# Patient Record
Sex: Male | Born: 1960 | Race: White | Hispanic: No | Smoking: Current every day smoker
Health system: Southern US, Community
[De-identification: ages and names within clinical notes are randomized; demographics above are authoritative.]

## PROBLEM LIST (undated history)

## (undated) DIAGNOSIS — W57XXXA Bitten or stung by nonvenomous insect and other nonvenomous arthropods, initial encounter: Secondary | ICD-10-CM

## (undated) DIAGNOSIS — F1010 Alcohol abuse, uncomplicated: Secondary | ICD-10-CM

## (undated) DIAGNOSIS — F32A Depression, unspecified: Secondary | ICD-10-CM

## (undated) DIAGNOSIS — F329 Major depressive disorder, single episode, unspecified: Secondary | ICD-10-CM

## (undated) DIAGNOSIS — M1990 Unspecified osteoarthritis, unspecified site: Secondary | ICD-10-CM

## (undated) HISTORY — DX: Major depressive disorder, single episode, unspecified: F32.9

## (undated) HISTORY — PX: LEG SURGERY: SHX1003

## (undated) HISTORY — PX: BACK SURGERY: SHX140

## (undated) HISTORY — PX: CARPAL TUNNEL RELEASE: SHX101

## (undated) HISTORY — DX: Unspecified osteoarthritis, unspecified site: M19.90

## (undated) HISTORY — PX: FRACTURE SURGERY: SHX138

## (undated) HISTORY — DX: Depression, unspecified: F32.A

## (undated) HISTORY — DX: Alcohol abuse, uncomplicated: F10.10

## (undated) HISTORY — DX: Bitten or stung by nonvenomous insect and other nonvenomous arthropods, initial encounter: W57.XXXA

---

## 2001-07-09 ENCOUNTER — Encounter: Payer: Self-pay | Admitting: Orthopedic Surgery

## 2001-07-09 ENCOUNTER — Encounter: Payer: Self-pay | Admitting: Emergency Medicine

## 2001-07-09 ENCOUNTER — Inpatient Hospital Stay (HOSPITAL_COMMUNITY): Admission: EM | Admit: 2001-07-09 | Discharge: 2001-07-12 | Payer: Self-pay | Admitting: Emergency Medicine

## 2010-02-17 ENCOUNTER — Emergency Department (HOSPITAL_COMMUNITY): Admission: EM | Admit: 2010-02-17 | Discharge: 2010-02-17 | Payer: Self-pay | Admitting: Emergency Medicine

## 2010-06-07 LAB — CULTURE, ROUTINE-ABSCESS

## 2010-08-12 NOTE — Discharge Summary (Signed)
[place]. [facility]  Patient:    Frank Patton, Frank Patton Visit Number: [id number] MRN: [id number]          Service Type: MED Location: 5000 [id number] Attending Physician:  Lubertha South Dictated by:   Ephriam Knuckles. Idolina Primer, P.A.C. Admit Date:  07/09/2001 Discharge Date: 07/12/2001                             Discharge Summary  ADMISSION DIAGNOSES: 1. Right tibia fibular fracture. 2. Smoking history.  DISCHARGE DIAGNOSES: 1. Right tibia fibular fracture. 2. Smoking history.  OPERATIONS: 1. Exploration, irrigation, and debridement of grade I open right tibia    fracture on July 09, 2001. 2. Reamed intermedullary nailing of right tibial fracture utilizing    Depuy titanium nail, statically locked.  ASSISTANT: Avel Peace, P.A.C.  CONSULTATIONS: None.  HISTORY OF PRESENT ILLNESS: This 50 year old gentleman was cutting down some tree limbs earlier on the day of admission. One of the tree limbs snapped and struck his right lower leg. He had immediate deformity to that leg and instability. He had pain and bleeding from the open wound. He was brought to the ER where a 2 cm transverse laceration of the medial aspect of the mid leg on the right was seen. Abundant bloody drainage with fat droplets was noted in this area. Fortunately, all of his lower leg compartments were soft and neurovascularly, he was intact. X-rays taken in the emergency room showed a mid tibia displaced fracture with butterfly fragment. There was also a transverse fracture of the adjacent fibula. Due to the nature of this injury being an open fracture as well as the instability, the patient was admitted for the above procedure.  HOSPITAL COURSE: The patient was placed on an antibiotic protocol with Gentamicin. This was due to the nature of the open fracture received from a dirty environment. We kept him on the antibiotic therapy for the prescribed time. Rather fortunately, the patient did  not have any evidence of any infection. We also supplemented the Gentamicin with Ancef for double coverage. The Ancef was discontinued on July 12, 2001 in the morning. This had been followed closely by the pharmacy with blood values as well. The Ancef was discontinued at discharge and the patient was switched to p.o. Keflex. Neurovascularly remained intact to the operative extremity throughout his hospitalization. Dressing changes showed no evidence of any infection. He was placed on Lovenox for DVT prevention while in the hospital and this was changed to enteric coated aspirin at the time of discharge.  LABORATORY DATA: White count 11.4 on admission. Red count was 3.35. Hemoglobin 11.1, hematocrit 32.8. This was essentially unchanged throughout his hospitalization. Blood chemistries were normal.  DIAGNOSTIC STUDIES: Chest x-ray showed no active disease. EKG showed normal sinus rhythm.  DISCHARGE CONDITION: Improved and stable.  DISPOSITION: Discharged to home.  DISCHARGE MEDICATIONS: 1. Keflex one b.i.d. 2. Percocet for pain. 3. Robaxin as a muscle relaxant. 4. Enteric coated aspirin one daily.  FOLLOW-UP: To return to see Dr. Rennis Chris two weeks after the date of discharge. Dictated by:   Ephriam Knuckles. Idolina Primer, P.A.C. Attending Physician:  Lubertha South DD:  07/25/01 TD:  07/28/01 Job: [id number]

## 2010-08-12 NOTE — H&P (Signed)
[place]. [facility]  Patient:    Frank Patton, Frank Patton Visit Number: [id number] MRN: [id number]          Service Type: MED Location: 1800 1823 01 Attending Physician:  Hanley Seamen Dictated by:   Alexzandrew L. Perkins, P.A.-C. Admit Date:  07/09/2001                           History and Physical  CHIEF COMPLAINT:  Right leg pain.  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  He was cutting a downed tree, the tree snapped back hitting the lateral portion of the right leg.  He had the immediate onset of pain and had a bleeding wound. He was brought to [facility] Emergency Department where the patient had sustained an open distal tibia-fibula fracture.  Dr. Vania Rea. Supple is oncall for [facility] and the hospital.  The patient was seen and evaluated and admitted for surgical intervention.  ALLERGIES:  No known drug allergies.  CURRENT MEDICATIONS:  None.  PAST MEDICAL HISTORY:  Negative.  PAST SURGICAL HISTORY:  Back surgery five years ago per Dr. Kerrin Champagne.  SOCIAL HISTORY:  He is divorced.  He has three sons.  One pack per day smoking history.  Only occasional intake of alcohol.  FAMILY HISTORY:  Mother living at 50 or 28 with diabetes.  Father living at 52 with history of throat cancer.  REVIEW OF SYSTEMS:  No fevers, chills, or night sweats.  NEUROLOGIC:  No seizures, syncope, or paralysis.  RESPIRATORY:  No shortness of breath, productive cough, hemoptysis.  CARDIOVASCULAR:  No chest pain, angina, orthopnea.  GASTROINTESTINAL:  No nausea, vomiting, diarrhea, or constipation. GENITOURINARY:  No dysuria or hematuria.  MUSCULOSKELETAL:  Pertinent of the right leg as found in the history of present illness.  PHYSICAL EXAMINATION:  VITAL SIGNS:  Temperature 97.6, pulse 55, respirations 18, blood  pressure 139/75.  GENERAL:  The patient is a 50 year old white male, well-developed, well-nourished.  He appears to be in mild distress secondary to right leg pain.  He is seen lying on a stretcher in the emergency department at [facility].  NEUROLOGICAL:  He is alert, oriented, and cooperative.  Appears to be a good historian.  HEENT:  Normocephalic and atraumatic.  Pupils are equal, round and reactive. Oropharynx is clear.  NECK:  Supple.  CHEST:  Clear to auscultation anterior and posterior chest walls.  No rhonchi or rales.  HEART:  Regular rate and rhythm.  No murmur, S1, or S2 noted.  ABDOMEN:  Soft.  Slight round.  Bowel sounds are present.  No rebound or guarding.  RECTAL:  Not done, not pertinent to present illness.  BREASTS:  Not done, not pertinent to present illness.  GENITALIA:  Not done, not pertinent to present illness.  EXTREMITIES:  Significant to right lower extremity.  He has an approximately 1 cm wound on the lower distal third on the medial anterior portion of the right leg.  This seems to be in the area where the fracture site is.  The patient has a fair amount of swelling in the left distal leg.  He has excellent sensation throughout the right lower extremity.  He has a +2 dorsalis pedis and a +2 posterior tibial noted on exam.  Motor function  showing he is moving his ankle and toes.  Well on exam.  LABORATORY DATA:  X-rays show a distal comminuted tibia-fibula fracture.  IMPRESSION: 1. Right tibia-fibula fracture. 2. Smoking history.  PLAN:  The patient will be admitted to [facility]  He will undergo open reduction and internal fixation of the right tibia utilizing intramedullary nailing.  Surgery was to be performed by Dr. Vania Rea. Supple. Dictated by:   Alexzandrew L. Perkins, P.A.-C. Attending Physician:  Hanley Seamen DD:  07/09/01 TD:  07/09/01 Job: [id number]

## 2010-08-12 NOTE — Op Note (Signed)
[place]. [facility]  Patient:    [name], CU Visit Number: [id number] MRN: [id number]          Service Type: MED Location: 1800 1823 01 Attending Physician:  Hanley Seamen Dictated by:   Vania Rea. Supple, M.D. Proc. Date: 07/09/01 Admit Date:  07/09/2001                             Operative Report  PREOPERATIVE DIAGNOSIS:  Grade 1 open right mid-shaft tibia-fibula fracture.  POSTOPERATIVE DIAGNOSIS:  Grade 1 open right mid-shaft tibia-fibula fracture.  OPERATIONS: 1. Exploration, irrigation, and debridement of grade 1 open right tibia    fracture. 2. Reamed intramedullary nailing of a right tibia fracture utilizing a Depuy    titanium nail 10 mm diameter by 3 to 4.5 cm in length, statically locked.  SURGEON:  Vania Rea. Supple, M.D.  ASSISTANT:  Alexzandrew L. Perkins, P.A.-C.  ANESTHESIA:  General endotracheal anesthesia.  TOURNIQUET TIME:  None was used.  ESTIMATED BLOOD LOSS:  Approximately 1200 cc.  DRAINS:  None.  INDICATION:  [name] is a 50 year old gentleman who was cutting some downed tree limbs earlier today when one of the tree limbs apparently snapped.  It struck him in the lateral aspect of his right leg.  He had immediate deformity instability, pain and bleeding from an open wound.  He was seen in the emergency room where he was found to have an approximately 2 cm transverse laceration over the medial aspect of the mid-leg.  Abundant bloody drainage with fat droplets was noted.  Compartments were soft and he was grossly neurovascularly intact.  Radiographs showed a displaced fracture of the mid-tibia with a butterfly fragment and a transverse fracture of the adjacent fibula.  He is planned for exploration, irrigation, and debridement of the open fracture with intramedullary stabilization at this time on an emergent basis.  Preoperatively, [name] was counselled on treatment options as well as the risks versus  benefits therefore of possible complications including infection, neurovascular injury, DVT, PE, malunion, nonunion, loss of fixation, and possible need for additional surgery were all reviewed.  He understands, accepts, and agrees with our plan.  DESCRIPTION OF PROCEDURE:  After undergoing routine preoperative evaluation, the patient was brought to the operating room and placed supine on the fracture table and underwent induction with general endotracheal anesthesia. At this point, a tourniquet was applied to the right side but not inflated. He had already received prophylactic antibiotics.  Splint and dressings were removed from right lower extremity and we did shave the knee around the fracture site and around the ankle.  We then sterilely prepped and draped in the leg in the standard fashion from the mid-thigh distally.  The knee was then flexed to approximately 45 degrees on a radiolucent support. We then extended the medial incision proximally and distally for a total length of approximately 6 cm.  Skin flaps were elevated and sharp dissection was carried down to the subcutaneous tissue to allow exposure of the fracture site.  The periosteum had been stripped proximally and distally.  We curetted the ends of the tibia proximally which appeared to have caused puncture wound. We then introduced the pulsatile lavage and copiously irrigated the wound with pulsatile saline.  Once we were convinced that the wound was meticulously clean, we again checked and there was no obvious retained organic material.  We then turned our attention to the knee  where we made a longitudinal 6 cm incision just medial to the infrapatellar tendon and extending across the adjacent portion of the patella.  We then divided the patella retinaculum adjacent to the infrapatellar tendon medially and then extended this partially around the medial aspect of the patella.  This allowed [place] to gain access to the  proximal flare of the tibia.  Utilizing fluoroscopic guidance, we introduced the guide pin into the tibial medullary canal with proper positioning confirmed on both AP and lateral fluoroscopic images.  We then over reamed this with a step drill.  We then passed a ball-tipped guide wire down the tibial canal, across the fracture site, and into the distal tibia with proper positioning confirmed on both AP and lateral fluoroscopic images.  We then measured for the appropriate length of the implant and this appeared to be 34.5 cm.  Sequential reamings were then performed up to a size 11.  We then selected a 10 mm by 34.5 cm titanium nail.  We switched out the reaming guide wire and put in a driving guide wire.  We then drove the intramedullary nail down to the tibial shaft and across the fracture site into the distal segment and again proper position was confirmed on both AP and lateral fluoroscopic images.  The nail was the seated to the appropriate depth. Utilizing fluoroscopic guidance, the distal locking screws were placed x 2. The production at the fracture site was near anatomic.  We did retrograde impact the nail to cause impaction across the fracture site and then we went ahead and placed the two proximal locking screws.  We again confirmed proper positioning of the locking screws utilizing fluoroscopic imaging.  Final imaging showed good alignment, proper position of hardware.  At this point, all wounds were copiously irrigated.  The proximal wound was closed with interrupted figure-of-eight 0 Vicryl sutures for the retinaculum, 2-0 Vicryl on the subcutaneous, and staples on the skin.  The traumatic wound, which we had extended, was closed with 2-0 nylon horizontal mattress sutures for the surgical portion of the wound and the traumatic wound was left open to drain.  The locking screw sites were all closed with staples.  A bulky dry dressing was wrapped about the right lower  extremity with Adaptic over the surgical sites.  At this point, the orthopedic procedure was completed and anesthesia came in  and placed and a femoral nerve block.  The patient was subsequently extubated and taken to the recovery room in stable condition. Dictated by:   Vania Rea. Supple, M.D. Attending Physician:  Hanley Seamen DD:  07/09/01 TD:  07/09/01 Job: [id number]

## 2011-02-01 ENCOUNTER — Emergency Department (HOSPITAL_COMMUNITY): Payer: Self-pay

## 2011-02-01 ENCOUNTER — Emergency Department (HOSPITAL_COMMUNITY)
Admission: EM | Admit: 2011-02-01 | Discharge: 2011-02-01 | Disposition: A | Discharge status: Home / Self Care | Payer: Self-pay | Attending: Emergency Medicine | Admitting: Emergency Medicine

## 2011-02-01 DIAGNOSIS — R1032 Left lower quadrant pain: Secondary | ICD-10-CM | POA: Insufficient documentation

## 2011-02-01 LAB — URINALYSIS, ROUTINE W REFLEX MICROSCOPIC
Bilirubin Urine: NEGATIVE
Glucose, UA: NEGATIVE mg/dL
Hgb urine dipstick: NEGATIVE
Ketones, ur: NEGATIVE mg/dL
Leukocytes, UA: NEGATIVE
Nitrite: NEGATIVE
Protein, ur: NEGATIVE mg/dL
Specific Gravity, Urine: >1.03 — ABNORMAL HIGH (ref 1.005–1.030)
Urobilinogen, UA: 0.2 mg/dL (ref 0.0–1.0)
pH: 5.5 (ref 5.0–8.0)

## 2011-02-01 MED ORDER — OXYCODONE-ACETAMINOPHEN 5-325 MG PO TABS: 1.0000 | ORAL_TABLET | ORAL | Status: AC | PRN

## 2011-02-01 MED ORDER — DOXYCYCLINE HYCLATE 100 MG PO CAPS: 100.0000 mg | ORAL_CAPSULE | Freq: Two times a day (BID) | ORAL | Status: AC

## 2011-02-01 MED ORDER — OXYCODONE-ACETAMINOPHEN 5-325 MG PO TABS
2.0000 | ORAL_TABLET | Freq: Once | ORAL | Status: AC
  Administered 2011-02-01: 2 via ORAL
  Filled 2011-02-01: qty 2

## 2011-02-01 MED ORDER — HYDROMORPHONE HCL PF 1 MG/ML IJ SOLN
1.0000 mg | Freq: Once | INTRAMUSCULAR | Status: AC
  Administered 2011-02-01: 1 mg via INTRAMUSCULAR
  Filled 2011-02-01: qty 1

## 2011-02-01 NOTE — ED Notes (Signed)
Pt to CT

## 2011-02-01 NOTE — ED Provider Notes (Signed)
Scribed for Frank Gaskins, MD, the patient was seen in room APAH3/APAH3. This chart was scribed by [employer]. The patient's care started at 20:53  CSN: [id number] Arrival date & time: 02/01/2011  8:31 PM   First MD Initiated Contact with Patient 02/01/11 2053      Chief Complaint  Patient presents with  . Groin Pain   HPI Frank Patton is a 50 y.o. male who presents to the Emergency Department complaining of Intermittent Groin Pain for 2 months. He reports that pain starts in his left groin area and radiates into his left lower back. He states that pain worsened today. Reports nausea, denies vomiting. Patient also denies chest pain, shortness of breath, new weakness in the legs, urine or bowel loss.  He is ambulatory No injury   PMH - none  Past Surgical History  Procedure Date  . Back surgery   . Leg surgery     No family history on file.  History  Substance Use Topics  . Smoking status: Current Everyday Smoker  . Smokeless tobacco: Not on file  . Alcohol Use: Yes      Review of Systems  All other systems reviewed and are negative.    Allergies  Review of patient's allergies indicates no known allergies.  Home Medications   Current Outpatient Rx  Name Route Sig Dispense Refill  . GOODY HEADACHE PO Oral Take 1 packet by mouth daily as needed. For pain       BP 122/74  Pulse 55  Temp(Src) 97.4 F (36.3 C) (Oral)  Resp 21  Ht 5\' 9"  (1.753 m)  Wt 162 lb (73.483 kg)  BMI 23.92 kg/m2  SpO2 99%  Physical Exam  CONSTITUTIONAL: Well developed/well nourished HEAD AND FACE: Normocephalic/atraumatic EYES: EOMI/PERRL ENMT: Mucous membranes moist NECK: supple no meningeal signs SPINE:entire spine nontender CV: S1/S2 noted, no murmurs/rubs/gallops noted LUNGS: Lungs are clear to auscultation bilaterally, no apparent distress ABDOMEN: soft, nontender, no rebound or guarding GU: Left cva tenderness, no testicular tenderness/edema/erythema, no hernia,  positive cremasteric reflexes. Chaperone present NEURO: Pt is awake/alert, moves all extremitiesx4, no focal motor deficit in his LE EXTREMITIES: pulses normal, full ROM SKIN: warm, color normal PSYCH: no abnormalities of mood noted   ED Course  Procedures   DIAGNOSTIC STUDIES: Oxygen Saturation is 99% on room air, normal by my interpretation.    COORDINATION OF CARE: 21:08 - EDP examined patient at bedside and ordered the following Orders Placed This Encounter  Procedures  . Urinalysis with microscopic     10:21 PM Pt with continued abd pain, and had flank pain will get ct imaging  Pt improved ?lymphadenopathy on CT imaging.  However GU exam unremarkable.   Denies fever, denies LE weakness Denies worsened nocturia I did place pt on abx, just in case this could be occult uti, will send urine culture I told him he needs repeat CT imaging to evaluate that lymphadenopathy has resolved  MDM: Nursing notes reviewed and considered in documentation All labs/vitals reviewed and considered    Scribe Attestation I personally performed the services described in this documentation, which was scribed in my presence. The recorded information has been reviewed and considered.       Frank Gaskins, MD 02/02/11 0130

## 2011-02-01 NOTE — ED Notes (Signed)
Pt reports pain that starts in his left groin area, and radiates to his left lower back.  Pt denies any GI/GU symptoms.  Pt denies any injury to the area.

## 2011-02-03 LAB — URINE CULTURE
Colony Count: NO GROWTH
Culture  Setup Time: 201211081336

## 2011-02-08 ENCOUNTER — Other Ambulatory Visit (HOSPITAL_COMMUNITY): Payer: Self-pay | Admitting: Urology

## 2011-02-08 DIAGNOSIS — N451 Epididymitis: Secondary | ICD-10-CM

## 2011-02-09 ENCOUNTER — Ambulatory Visit (HOSPITAL_COMMUNITY)
Admission: RE | Admit: 2011-02-09 | Discharge: 2011-02-09 | Disposition: A | Discharge status: Home / Self Care | Payer: Self-pay | Source: Ambulatory Visit | Attending: Urology | Admitting: Urology

## 2011-02-09 ENCOUNTER — Other Ambulatory Visit (HOSPITAL_COMMUNITY): Payer: Self-pay | Admitting: Urology

## 2011-02-09 DIAGNOSIS — N433 Hydrocele, unspecified: Secondary | ICD-10-CM | POA: Insufficient documentation

## 2011-02-09 DIAGNOSIS — N508 Other specified disorders of male genital organs: Secondary | ICD-10-CM | POA: Insufficient documentation

## 2011-02-09 DIAGNOSIS — N451 Epididymitis: Secondary | ICD-10-CM

## 2014-11-12 ENCOUNTER — Encounter: Payer: Self-pay | Admitting: Family Medicine

## 2014-11-12 ENCOUNTER — Ambulatory Visit (INDEPENDENT_AMBULATORY_CARE_PROVIDER_SITE_OTHER): Payer: Medicaid Other | Admitting: Family Medicine

## 2014-11-12 ENCOUNTER — Encounter (INDEPENDENT_AMBULATORY_CARE_PROVIDER_SITE_OTHER): Payer: Self-pay

## 2014-11-12 VITALS — BP 124/75 | HR 63 | Temp 97.4°F | Ht 69.0 in | Wt 142.4 lb

## 2014-11-12 DIAGNOSIS — Z716 Tobacco abuse counseling: Secondary | ICD-10-CM | POA: Diagnosis not present

## 2014-11-12 DIAGNOSIS — Z1211 Encounter for screening for malignant neoplasm of colon: Secondary | ICD-10-CM | POA: Diagnosis not present

## 2014-11-12 DIAGNOSIS — Z13220 Encounter for screening for lipoid disorders: Secondary | ICD-10-CM | POA: Insufficient documentation

## 2014-11-12 DIAGNOSIS — R634 Abnormal weight loss: Secondary | ICD-10-CM | POA: Diagnosis not present

## 2014-11-12 DIAGNOSIS — Z720 Tobacco use: Secondary | ICD-10-CM

## 2014-11-12 MED ORDER — VARENICLINE TARTRATE 1 MG PO TABS: 1.0000 mg | ORAL_TABLET | Freq: Two times a day (BID) | ORAL | Status: DC

## 2014-11-12 MED ORDER — VARENICLINE TARTRATE 0.5 MG X 11 & 1 MG X 42 PO MISC: ORAL | Status: DC

## 2014-11-12 NOTE — Patient Instructions (Signed)
Smoking Cessation Quitting smoking is important to your health and has many advantages. However, it is not always easy to quit since nicotine is a very addictive drug. Oftentimes, people try 3 times or more before being able to quit. This document explains the best ways for you to prepare to quit smoking. Quitting takes hard work and a lot of effort, but you can do it. ADVANTAGES OF QUITTING SMOKING  You will live longer, feel better, and live better.  Your body will feel the impact of quitting smoking almost immediately.  Within 20 minutes, blood pressure decreases. Your pulse returns to its normal level.  After 8 hours, carbon monoxide levels in the blood return to normal. Your oxygen level increases.  After 24 hours, the chance of having a heart attack starts to decrease. Your breath, hair, and body stop smelling like smoke.  After 48 hours, damaged nerve endings begin to recover. Your sense of taste and smell improve.  After 72 hours, the body is virtually free of nicotine. Your bronchial tubes relax and breathing becomes easier.  After 2 to 12 weeks, lungs can hold more air. Exercise becomes easier and circulation improves.  The risk of having a heart attack, stroke, cancer, or lung disease is greatly reduced.  After 1 year, the risk of coronary heart disease is cut in half.  After 5 years, the risk of stroke falls to the same as a nonsmoker.  After 10 years, the risk of lung cancer is cut in half and the risk of other cancers decreases significantly.  After 15 years, the risk of coronary heart disease drops, usually to the level of a nonsmoker.  If you are pregnant, quitting smoking will improve your chances of having a healthy baby.  The people you live with, especially any children, will be healthier.  You will have extra money to spend on things other than cigarettes. QUESTIONS TO THINK ABOUT BEFORE ATTEMPTING TO QUIT You may want to talk about your answers with your  health care provider.  Why do you want to quit?  If you tried to quit in the past, what helped and what did not?  What will be the most difficult situations for you after you quit? How will you plan to handle them?  Who can help you through the tough times? Your family? Friends? A health care provider?  What pleasures do you get from smoking? What ways can you still get pleasure if you quit? Here are some questions to ask your health care provider:  How can you help me to be successful at quitting?  What medicine do you think would be best for me and how should I take it?  What should I do if I need more help?  What is smoking withdrawal like? How can I get information on withdrawal? GET READY  Set a quit date.  Change your environment by getting rid of all cigarettes, ashtrays, matches, and lighters in your home, car, or work. Do not let people smoke in your home.  Review your past attempts to quit. Think about what worked and what did not. GET SUPPORT AND ENCOURAGEMENT You have a better chance of being successful if you have help. You can get support in many ways.  Tell your family, friends, and coworkers that you are going to quit and need their support. Ask them not to smoke around you.  Get individual, group, or telephone counseling and support. Programs are available at local hospitals and health centers. Call   your local health department for information about programs in your area.  Spiritual beliefs and practices may help some smokers quit.  Download a "quit meter" on your computer to keep track of quit statistics, such as how long you have gone without smoking, cigarettes not smoked, and money saved.  Get a self-help book about quitting smoking and staying off tobacco. LEARN NEW SKILLS AND BEHAVIORS  Distract yourself from urges to smoke. Talk to someone, go for a walk, or occupy your time with a task.  Change your normal routine. Take a different route to work.  Drink tea instead of coffee. Eat breakfast in a different place.  Reduce your stress. Take a hot bath, exercise, or read a book.  Plan something enjoyable to do every day. Reward yourself for not smoking.  Explore interactive web-based programs that specialize in helping you quit. GET MEDICINE AND USE IT CORRECTLY Medicines can help you stop smoking and decrease the urge to smoke. Combining medicine with the above behavioral methods and support can greatly increase your chances of successfully quitting smoking.  Nicotine replacement therapy helps deliver nicotine to your body without the negative effects and risks of smoking. Nicotine replacement therapy includes nicotine gum, lozenges, inhalers, nasal sprays, and skin patches. Some may be available over-the-counter and others require a prescription.  Antidepressant medicine helps people abstain from smoking, but how this works is unknown. This medicine is available by prescription.  Nicotinic receptor partial agonist medicine simulates the effect of nicotine in your brain. This medicine is available by prescription. Ask your health care provider for advice about which medicines to use and how to use them based on your health history. Your health care provider will tell you what side effects to look out for if you choose to be on a medicine or therapy. Carefully read the information on the package. Do not use any other product containing nicotine while using a nicotine replacement product.  RELAPSE OR DIFFICULT SITUATIONS Most relapses occur within the first 3 months after quitting. Do not be discouraged if you start smoking again. Remember, most people try several times before finally quitting. You may have symptoms of withdrawal because your body is used to nicotine. You may crave cigarettes, be irritable, feel very hungry, cough often, get headaches, or have difficulty concentrating. The withdrawal symptoms are only temporary. They are strongest  when you first quit, but they will go away within 10-14 days. To reduce the chances of relapse, try to:  Avoid drinking alcohol. Drinking lowers your chances of successfully quitting.  Reduce the amount of caffeine you consume. Once you quit smoking, the amount of caffeine in your body increases and can give you symptoms, such as a rapid heartbeat, sweating, and anxiety.  Avoid smokers because they can make you want to smoke.  Do not let weight gain distract you. Many smokers will gain weight when they quit, usually less than 10 pounds. Eat a healthy diet and stay active. You can always lose the weight gained after you quit.  Find ways to improve your mood other than smoking. FOR MORE INFORMATION  www.smokefree.gov  Document Released: 03/07/2001 Document Revised: 07/28/2013 Document Reviewed: 06/22/2011 Exit[employer] Patient Information 2015 [employer]. This information is not intended to replace advice given to you by your health care provider. Make sure you discuss any questions you have with your health care provider.  

## 2014-11-12 NOTE — Assessment & Plan Note (Signed)
Send to [facility] for smoking cessation, start chantix

## 2014-11-12 NOTE — Progress Notes (Signed)
BP 124/75 mmHg  Pulse 63  Temp(Src) 97.4 F (36.3 C) (Oral)  Ht 5\' 9"  (1.753 m)  Wt 142 lb 6.4 oz (64.592 kg)  BMI 21.02 kg/m2   Subjective:    Patient ID: Frank Patton, male    DOB: 1961-03-22, 53 y.o.   MRN: [id number]  HPI: Frank Patton is a 54 y.o. male presenting on 11/12/2014 for Establish Care   HPI Smoking cessation Patient presents today with desires to quit smoking. His wife is currently taking Chantix and he like to try the same. He is currently smoking 1 pack per day and has been smoking that way for 30 years. He does admit to having intermittent shortness of breath and wheezing which is one of the major reasons why he like to quit. He states that he is at a 6 out of 10 in desire to quit.  Weight loss Patient also has loss of weight of 20-30 pounds in the past 3 months. During that time his wife has been battling cancer and he has been helping take care of her which may attribute to some of this. He wants to make sure that there is no other major issues as to why he is losing weight. He has started to gain a little weight back now that his wife is been cleared of cancer on back home a lot more.  Patient also presents today wanting do some screening labs for his age. This includes a colonoscopy, diabetes screening and cholesterol screening.  Relevant past medical, surgical, family and social history reviewed and updated as indicated. Interim medical history since our last visit reviewed. Allergies and medications reviewed and updated.  Review of Systems  Constitutional: Positive for unexpected weight change. Negative for fever.  HENT: Negative for ear discharge and ear pain.   Eyes: Negative for discharge and visual disturbance.  Respiratory: Positive for shortness of breath (intermittent) and wheezing. Negative for chest tightness.   Cardiovascular: Negative for chest pain, palpitations and leg swelling.  Gastrointestinal: Negative for abdominal pain, diarrhea and  constipation.  Genitourinary: Negative for difficulty urinating.  Musculoskeletal: Negative for back pain and gait problem.  Skin: Negative for rash.  Neurological: Negative for dizziness, syncope, light-headedness and headaches.  All other systems reviewed and are negative.   Per HPI unless specifically indicated above     Medication List       This list is accurate as of: 11/12/14  1:53 PM.  Always use your most recent med list.               GOODY HEADACHE PO  Take 1 packet by mouth daily as needed. For pain     varenicline 0.5 MG X 11 & 1 MG X 42 tablet  Commonly known as:  CHANTIX STARTING MONTH PAK  Take 0.5 mg tablet by mouth daily for 3 days, increase to 0.5 mg tablet twice daily for 4 days, increase to 1 mg tablet twice daily.     varenicline 1 MG tablet  Commonly known as:  CHANTIX CONTINUING MONTH PAK  Take 1 tablet (1 mg total) by mouth 2 (two) times daily.           Objective:    BP 124/75 mmHg  Pulse 63  Temp(Src) 97.4 F (36.3 C) (Oral)  Ht 5\' 9"  (1.753 m)  Wt 142 lb 6.4 oz (64.592 kg)  BMI 21.02 kg/m2  Wt Readings from Last 3 Encounters:  11/12/14 142 lb 6.4 oz (64.592 kg)  02/01/11 162 lb (73.483 kg)    Physical Exam  Constitutional: He is oriented to person, place, and time. He appears well-developed and well-nourished. No distress.  HENT:  Right Ear: External ear normal.  Left Ear: External ear normal.  Nose: Nose normal.  Mouth/Throat: Oropharynx is clear and moist. No oropharyngeal exudate.  Eyes: Conjunctivae and EOM are normal. Pupils are equal, round, and reactive to light. Right eye exhibits no discharge. No scleral icterus.  Neck: Neck supple. No thyromegaly present.  Cardiovascular: Normal rate, regular rhythm, normal heart sounds and intact distal pulses.   No murmur heard. Pulmonary/Chest: Effort normal and breath sounds normal. No respiratory distress. He has no wheezes.  Abdominal: He exhibits no distension. There is no  tenderness. There is no rebound and no guarding.  Musculoskeletal: Normal range of motion. He exhibits no edema.  Lymphadenopathy:    He has no cervical adenopathy.  Neurological: He is alert and oriented to person, place, and time. Coordination normal.  Skin: Skin is warm and dry. No rash noted. He is not diaphoretic.  Psychiatric: He has a normal mood and affect. His behavior is normal.  Vitals reviewed.   Results for orders placed or performed during the hospital encounter of 02/01/11  Urine culture  Result Value Ref Range   Specimen Description URINE, CLEAN CATCH    Special Requests NONE    Culture  Setup Time [id number]    Colony Count NO GROWTH    Culture NO GROWTH    Report Status 02/03/2011 FINAL   Urinalysis with microscopic  Result Value Ref Range   Color, Urine YELLOW YELLOW   APPearance CLEAR CLEAR   Specific Gravity, Urine >1.030 (H) 1.005 - 1.030   pH 5.5 5.0 - 8.0   Glucose, UA NEGATIVE NEGATIVE mg/dL   Hgb urine dipstick NEGATIVE NEGATIVE   Bilirubin Urine NEGATIVE NEGATIVE   Ketones, ur NEGATIVE NEGATIVE mg/dL   Protein, ur NEGATIVE NEGATIVE mg/dL   Urobilinogen, UA 0.2 0.0 - 1.0 mg/dL   Nitrite NEGATIVE NEGATIVE   Leukocytes, UA NEGATIVE NEGATIVE      Assessment & Plan:   Problem List Items Addressed This Visit      Other   Encounter for smoking cessation counseling - Primary    Send to [facility] for smoking cessation, start chantix      Relevant Medications   varenicline (CHANTIX STARTING MONTH PAK) 0.5 MG X 11 & 1 MG X 42 tablet   varenicline (CHANTIX CONTINUING MONTH PAK) 1 MG tablet   Screening for lipoid disorders   Relevant Orders   Lipid panel   Loss of weight   Relevant Orders   CBC with Differential/Platelet   BMP8+EGFR   Thyroid Panel With TSH   Encounter for screening colonoscopy   Relevant Orders   Ambulatory referral to Gastroenterology       Follow up plan: Return in about 4 weeks (around 12/10/2014), or if symptoms worsen or  fail to improve.  Caryl Pina, MD [facility] 11/12/2014, 1:53 PM

## 2014-11-13 ENCOUNTER — Encounter: Payer: Self-pay | Admitting: Gastroenterology

## 2014-11-13 LAB — CBC WITH DIFFERENTIAL/PLATELET
BASOS ABS: 0.1 10*3/uL (ref 0.0–0.2)
Basos: 1 %
EOS (ABSOLUTE): 0.4 10*3/uL (ref 0.0–0.4)
Eos: 5 %
Hematocrit: 41 % (ref 37.5–51.0)
Hemoglobin: 13.9 g/dL (ref 12.6–17.7)
IMMATURE GRANULOCYTES: 0 %
Immature Grans (Abs): 0 10*3/uL (ref 0.0–0.1)
Lymphocytes Absolute: 1.9 10*3/uL (ref 0.7–3.1)
Lymphs: 26 %
MCH: 34.7 pg — ABNORMAL HIGH (ref 26.6–33.0)
MCHC: 33.9 g/dL (ref 31.5–35.7)
MCV: 102 fL — AB (ref 79–97)
MONOS ABS: 0.5 10*3/uL (ref 0.1–0.9)
Monocytes: 7 %
NEUTROS PCT: 61 %
Neutrophils Absolute: 4.3 10*3/uL (ref 1.4–7.0)
PLATELETS: 275 10*3/uL (ref 150–379)
RBC: 4.01 x10E6/uL — AB (ref 4.14–5.80)
RDW: 13.4 % (ref 12.3–15.4)
WBC: 7.1 10*3/uL (ref 3.4–10.8)

## 2014-11-13 LAB — LIPID PANEL
CHOL/HDL RATIO: 3 ratio (ref 0.0–5.0)
Cholesterol, Total: 143 mg/dL (ref 100–199)
HDL: 48 mg/dL (ref >39)
LDL CALC: 79 mg/dL (ref 0–99)
Triglycerides: 82 mg/dL (ref 0–149)
VLDL CHOLESTEROL CAL: 16 mg/dL (ref 5–40)

## 2014-11-13 LAB — BMP8+EGFR
BUN / CREAT RATIO: 16 (ref 9–20)
BUN: 16 mg/dL (ref 6–24)
CHLORIDE: 99 mmol/L (ref 97–108)
CO2: 25 mmol/L (ref 18–29)
Calcium: 9.6 mg/dL (ref 8.7–10.2)
Creatinine, Ser: 1.03 mg/dL (ref 0.76–1.27)
GFR calc Af Amer: 95 mL/min/{1.73_m2} (ref >59)
GFR calc non Af Amer: 83 mL/min/{1.73_m2} (ref >59)
GLUCOSE: 99 mg/dL (ref 65–99)
Potassium: 4.9 mmol/L (ref 3.5–5.2)
SODIUM: 139 mmol/L (ref 134–144)

## 2014-11-13 LAB — THYROID PANEL WITH TSH
Free Thyroxine Index: 1.9 (ref 1.2–4.9)
T3 Uptake Ratio: 30 % (ref 24–39)
T4, Total: 6.4 ug/dL (ref 4.5–12.0)
TSH: 0.526 u[IU]/mL (ref 0.450–4.500)

## 2014-12-10 ENCOUNTER — Encounter: Payer: Self-pay | Admitting: Family Medicine

## 2014-12-10 ENCOUNTER — Other Ambulatory Visit: Payer: Self-pay

## 2014-12-10 ENCOUNTER — Telehealth: Payer: Self-pay

## 2014-12-10 ENCOUNTER — Ambulatory Visit (INDEPENDENT_AMBULATORY_CARE_PROVIDER_SITE_OTHER): Payer: Medicaid Other | Admitting: Family Medicine

## 2014-12-10 VITALS — BP 112/88 | HR 63 | Temp 97.6°F | Ht 69.0 in | Wt 143.4 lb

## 2014-12-10 DIAGNOSIS — Z716 Tobacco abuse counseling: Secondary | ICD-10-CM

## 2014-12-10 DIAGNOSIS — Z720 Tobacco use: Secondary | ICD-10-CM | POA: Diagnosis not present

## 2014-12-10 MED ORDER — VARENICLINE TARTRATE 0.5 MG X 11 & 1 MG X 42 PO MISC: ORAL | Status: DC

## 2014-12-10 MED ORDER — VARENICLINE TARTRATE 1 MG PO TABS: 1.0000 mg | ORAL_TABLET | Freq: Two times a day (BID) | ORAL | Status: DC

## 2014-12-10 NOTE — Assessment & Plan Note (Signed)
Patient was unable to get Chantix because of cost and it wasn't covered under his Medicaid plan. Recommended gum or patches over-the-counter.

## 2014-12-10 NOTE — Telephone Encounter (Signed)
Please call in chantix starter pack

## 2014-12-10 NOTE — Telephone Encounter (Signed)
Starter pack Rx called to [facility]

## 2014-12-10 NOTE — Progress Notes (Signed)
BP 112/88 mmHg  Pulse 63  Temp(Src) 97.6 F (36.4 C) (Oral)  Ht 5\' 9"  (1.753 m)  Wt 143 lb 6.4 oz (65.046 kg)  BMI 21.17 kg/m2   Subjective:    Patient ID: Frank Patton, male    DOB: [birth year], 54 y.o.   MRN: [id number]  HPI: Frank Patton is a 54 y.o. male presenting on 12/10/2014 for Follow up smoking cessation   HPI Smoking cessation recheck Patient is here for smoking cessation recheck. We prescribed Chantix for him but he was unable to get up because he thinks his Medicaid doesn't cover it and it was going to cost him $375. He has on his own been able to reduce his smoking intake by about 5 or 6 cigarettes a day. He denies any chest pain or shortness of breath. He will continue working on reducing his cigarette intake. His wife is clearly same time with him.  Relevant past medical, surgical, family and social history reviewed and updated as indicated. Interim medical history since our last visit reviewed. Allergies and medications reviewed and updated.  Review of Systems  Constitutional: Negative for fever and chills.  HENT: Negative for congestion, ear discharge and ear pain.   Eyes: Negative for discharge and visual disturbance.  Respiratory: Negative for cough, chest tightness, shortness of breath and wheezing.   Cardiovascular: Negative for chest pain and leg swelling.  Gastrointestinal: Negative for abdominal pain, diarrhea and constipation.  Genitourinary: Negative for difficulty urinating.  Musculoskeletal: Negative for back pain and gait problem.  Skin: Negative for rash.  Neurological: Negative for dizziness, syncope, light-headedness and headaches.  All other systems reviewed and are negative.   Per HPI unless specifically indicated above     Medication List       This list is accurate as of: 12/10/14  8:27 AM.  Always use your most recent med list.               GOODY HEADACHE PO  Take 1 packet by mouth daily as needed. For pain     varenicline 0.5 MG X 11 & 1 MG X 42 tablet  Commonly known as:  CHANTIX STARTING MONTH PAK  Take 0.5 mg tablet by mouth daily for 3 days, increase to 0.5 mg tablet twice daily for 4 days, increase to 1 mg tablet twice daily.     varenicline 1 MG tablet  Commonly known as:  CHANTIX CONTINUING MONTH PAK  Take 1 tablet (1 mg total) by mouth 2 (two) times daily.           Objective:    BP 112/88 mmHg  Pulse 63  Temp(Src) 97.6 F (36.4 C) (Oral)  Ht 5\' 9"  (1.753 m)  Wt 143 lb 6.4 oz (65.046 kg)  BMI 21.17 kg/m2  Wt Readings from Last 3 Encounters:  12/10/14 143 lb 6.4 oz (65.046 kg)  11/12/14 142 lb 6.4 oz (64.592 kg)  02/01/11 162 lb (73.483 kg)    Physical Exam  Constitutional: He is oriented to person, place, and time. He appears well-developed and well-nourished. No distress.  Eyes: Conjunctivae and EOM are normal. Right eye exhibits no discharge. No scleral icterus.  Cardiovascular: Normal rate, regular rhythm, normal heart sounds and intact distal pulses.   No murmur heard. Pulmonary/Chest: Effort normal and breath sounds normal. No respiratory distress. He has no wheezes. He has no rales. He exhibits no tenderness.  Abdominal: He exhibits no distension.  Musculoskeletal: Normal range of motion. He exhibits no  edema.  Neurological: He is alert and oriented to person, place, and time. Coordination normal.  Skin: Skin is warm and dry. No rash noted. He is not diaphoretic.  Psychiatric: He has a normal mood and affect. His behavior is normal.  Vitals reviewed.   Results for orders placed or performed in visit on 11/12/14  [facility]  Result Value Ref Range   Glucose 99 65 - 99 mg/dL   BUN 16 6 - 24 mg/dL   Creatinine, Ser 1.03 0.76 - 1.27 mg/dL   GFR calc non Af Amer 83 >59 mL/min/1.73   GFR calc Af Amer 95 >59 mL/min/1.73   BUN/Creatinine Ratio 16 9 - 20   Sodium 139 134 - 144 mmol/L   Potassium 4.9 3.5 - 5.2 mmol/L   Chloride 99 97 - 108 mmol/L   CO2 25 18 - 29  mmol/L   Calcium 9.6 8.7 - 10.2 mg/dL  Thyroid Panel With TSH  Result Value Ref Range   TSH 0.526 0.450 - 4.500 uIU/mL   T4, Total 6.4 4.5 - 12.0 ug/dL   T3 Uptake Ratio 30 24 - 39 %   Free Thyroxine Index 1.9 1.2 - 4.9  Lipid panel  Result Value Ref Range   Cholesterol, Total 143 100 - 199 mg/dL   Triglycerides 82 0 - 149 mg/dL   HDL 48 >39 mg/dL   VLDL Cholesterol Cal 16 5 - 40 mg/dL   LDL Calculated 79 0 - 99 mg/dL   Chol/HDL Ratio 3.0 0.0 - 5.0 ratio units  CBC with Differential/Platelet  Result Value Ref Range   WBC 7.1 3.4 - 10.8 x10E3/uL   RBC 4.01 (L) 4.14 - 5.80 x10E6/uL   Hemoglobin 13.9 12.6 - 17.7 g/dL   Hematocrit 41.0 37.5 - 51.0 %   MCV 102 (H) 79 - 97 fL   MCH 34.7 (H) 26.6 - 33.0 pg   MCHC 33.9 31.5 - 35.7 g/dL   RDW 13.4 12.3 - 15.4 %   Platelets 275 150 - 379 x10E3/uL   Neutrophils 61 %   Lymphs 26 %   Monocytes 7 %   Eos 5 %   Basos 1 %   Neutrophils Absolute 4.3 1.4 - 7.0 x10E3/uL   Lymphocytes Absolute 1.9 0.7 - 3.1 x10E3/uL   Monocytes Absolute 0.5 0.1 - 0.9 x10E3/uL   EOS (ABSOLUTE) 0.4 0.0 - 0.4 x10E3/uL   Basophils Absolute 0.1 0.0 - 0.2 x10E3/uL   Immature Granulocytes 0 %   Immature Grans (Abs) 0.0 0.0 - 0.1 x10E3/uL      Assessment & Plan:   Problem List Items Addressed This Visit      Other   Encounter for smoking cessation counseling - Primary    Patient was unable to get Chantix because of cost and it wasn't covered under his Medicaid plan. Recommended gum or patches over-the-counter.          Follow up plan: Return in about 1 year (around 12/10/2015), or if symptoms worsen or fail to improve.  Caryl Pina, MD [facility] 12/10/2014, 8:27 AM

## 2014-12-15 NOTE — Telephone Encounter (Signed)
Error

## 2015-01-06 ENCOUNTER — Ambulatory Visit (AMBULATORY_SURGERY_CENTER): Payer: Self-pay

## 2015-01-06 VITALS — Ht 69.0 in | Wt 147.6 lb

## 2015-01-06 DIAGNOSIS — Z800 Family history of malignant neoplasm of digestive organs: Secondary | ICD-10-CM

## 2015-01-06 MED ORDER — SUPREP BOWEL PREP KIT 17.5-3.13-1.6 GM/177ML PO SOLN: 1.0000 | Freq: Once | ORAL | Status: DC

## 2015-01-06 NOTE — Progress Notes (Signed)
No allergies to eggs or soy No diet/weight loss meds No home oxygen No past problems with anesthesia  Refused emmi; only has internet on phone

## 2015-01-20 ENCOUNTER — Ambulatory Visit (AMBULATORY_SURGERY_CENTER): Payer: Medicaid Other | Admitting: Gastroenterology

## 2015-01-20 ENCOUNTER — Encounter: Payer: Self-pay | Admitting: Gastroenterology

## 2015-01-20 VITALS — BP 118/82 | HR 66 | Temp 96.8°F | Resp 151 | Ht 69.0 in | Wt 147.0 lb

## 2015-01-20 DIAGNOSIS — K621 Rectal polyp: Secondary | ICD-10-CM

## 2015-01-20 DIAGNOSIS — Z1211 Encounter for screening for malignant neoplasm of colon: Secondary | ICD-10-CM

## 2015-01-20 DIAGNOSIS — Z800 Family history of malignant neoplasm of digestive organs: Secondary | ICD-10-CM

## 2015-01-20 DIAGNOSIS — D123 Benign neoplasm of transverse colon: Secondary | ICD-10-CM | POA: Diagnosis not present

## 2015-01-20 DIAGNOSIS — D128 Benign neoplasm of rectum: Secondary | ICD-10-CM

## 2015-01-20 DIAGNOSIS — D122 Benign neoplasm of ascending colon: Secondary | ICD-10-CM

## 2015-01-20 MED ORDER — SODIUM CHLORIDE 0.9 % IV SOLN: 500.0000 mL | INTRAVENOUS | Status: DC

## 2015-01-20 NOTE — Patient Instructions (Signed)
Findings:  Polyps, diverticulosis, internal hemorrhoids Recommendations:  No aspirin or aspirin products for two weeks.  Take tylenol for pain.  Resume diet and medications, Wait for pathology results.  YOU HAD AN ENDOSCOPIC PROCEDURE TODAY AT [facility]:   Refer to the procedure report that was given to you for any specific questions about what was found during the examination.  If the procedure report does not answer your questions, please call your gastroenterologist to clarify.  If you requested that your care partner not be given the details of your procedure findings, then the procedure report has been included in a sealed envelope for you to review at your convenience later.  YOU SHOULD EXPECT: Some feelings of bloating in the abdomen. Passage of more gas than usual.  Walking can help get rid of the air that was put into your GI tract during the procedure and reduce the bloating. If you had a lower endoscopy (such as a colonoscopy or flexible sigmoidoscopy) you may notice spotting of blood in your stool or on the toilet paper. If you underwent a bowel prep for your procedure, you may not have a normal bowel movement for a few days.  Please Note:  You might notice some irritation and congestion in your nose or some drainage.  This is from the oxygen used during your procedure.  There is no need for concern and it should clear up in a day or so.  SYMPTOMS TO REPORT IMMEDIATELY:   Following lower endoscopy (colonoscopy or flexible sigmoidoscopy):  Excessive amounts of blood in the stool  Significant tenderness or worsening of abdominal pains  Swelling of the abdomen that is new, acute  Fever of 100F or higher   Following upper endoscopy (EGD)  Vomiting of blood or coffee ground material  New chest pain or pain under the shoulder blades  Painful or persistently difficult swallowing  New shortness of breath  Fever of 100F or higher  Black, tarry-looking stools  For  urgent or emergent issues, a gastroenterologist can be reached at any hour by calling [phone].   DIET: Your first meal following the procedure should be a small meal and then it is ok to progress to your normal diet. Heavy or fried foods are harder to digest and may make you feel nauseous or bloated.  Likewise, meals heavy in dairy and vegetables can increase bloating.  Drink plenty of fluids but you should avoid alcoholic beverages for 24 hours.  ACTIVITY:  You should plan to take it easy for the rest of today and you should NOT DRIVE or use heavy machinery until tomorrow (because of the sedation medicines used during the test).    FOLLOW UP: Our staff will call the number listed on your records the next business day following your procedure to check on you and address any questions or concerns that you may have regarding the information given to you following your procedure. If we do not reach you, we will leave a message.  However, if you are feeling well and you are not experiencing any problems, there is no need to return our call.  We will assume that you have returned to your regular daily activities without incident.  If any biopsies were taken you will be contacted by phone or by letter within the next 1-3 weeks.  Please call us at [phone] if you have not heard about the biopsies in 3 weeks.    SIGNATURES/CONFIDENTIALITY: You and/or your care partner have  signed paperwork which will be entered into your electronic medical record.  These signatures attest to the fact that that the information above on your After Visit Summary has been reviewed and is understood.  Full responsibility of the confidentiality of this discharge information lies with you and/or your care-partner.  Please follow all discharge instructions given to you by the recovery room nurse. If you have any questions or problems after discharge please call one of the numbers listed above. You will receive a phone  call in the am to see how you are doing and answer any questions you may have. Thank you for choosing [facility] for your health care needs.

## 2015-01-20 NOTE — Op Note (Signed)
[facility]  [address]. [place], [address]   COLONOSCOPY PROCEDURE REPORT  PATIENT: Frank Patton, Frank Patton  MR#: [id number] BIRTHDATE: [birth year] , 10  yrs. old GENDER: male ENDOSCOPIST: Yetta Flock, MD REFERRED BY: Caryl Pina MD PROCEDURE DATE:  01/20/2015 PROCEDURE:   Colonoscopy, screening, Colonoscopy with snare polypectomy, and Colonoscopy with biopsy First Screening Colonoscopy - Avg.  risk and is 50 yrs.  old or older Yes.  Prior Negative Screening - Now for repeat screening. N/A  History of Adenoma - Now for follow-up colonoscopy & has been > or = to 3 yrs.  N/A  Polyps removed today? Yes ASA CLASS:   Class II INDICATIONS:Screening for colonic neoplasia and FH Colon or Rectal Adenocarcinoma.   Brother age 23, parent age 45 MEDICATIONS: Propofol 500 mg IV  DESCRIPTION OF PROCEDURE:   After the risks benefits and alternatives of the procedure were thoroughly explained, informed consent was obtained.  The digital rectal exam revealed no abnormalities of the rectum.   The LB [id number]  endoscope was introduced through the anus and advanced to the cecum, which was identified by both the appendix and ileocecal valve. No adverse events experienced.   The quality of the prep was adequate  The instrument was then slowly withdrawn as the colon was fully examined. Estimated blood loss is zero unless otherwise noted in this procedure report.   COLON FINDINGS: A 35mm sessile polyp in the ascending colon was removed with cold forceps.  A roughly 1cm flat polyp was noted in the ascending colon and removed via hot snare.  A 33mm sessile polyp was noted in the transverse colon and removed with cold forceps. An 64mm and a 6-96mm flat polyp was noted in the transverse colon, and both removed via hot snare.  A 25mm sessile rectal polyp was removed via hot snare.  Mild diverticulosis was noted in the sigmoid colon.  The remainder of the examined colon was  normal. The remainder of the examined colon was normal. Retroflexed views revealed internal hemorrhoids. The time to cecum = 3.3 Withdrawal time = 30.5   The scope was withdrawn and the procedure completed. COMPLICATIONS: There were no immediate complications.  ENDOSCOPIC IMPRESSION: Multiple polyps removed as described above Mild sigmoid diverticulosis Internal hemorrhoids  RECOMMENDATIONS: No aspirin or NSAIDs for 2 weeks following polypectomy, tylenol ok for pain Resume diet Resume medications Await pathology results  eSigned:  Yetta Flock, MD 01/20/2015 9:37 AM   cc: Caryl Pina MD, the patient   PATIENT NAME:  Frank Patton, Frank Patton MR#: [id number]

## 2015-01-20 NOTE — Progress Notes (Signed)
Transferred to recovery room. A/O x3, pleased with MAC.  VSS.  Report to Tracy, RN. 

## 2015-01-20 NOTE — Progress Notes (Signed)
Called to room to assist during endoscopic procedure.  Patient ID and intended procedure confirmed with present staff. Received instructions for my participation in the procedure from the performing physician.  

## 2015-01-21 ENCOUNTER — Telehealth: Payer: Self-pay | Admitting: *Deleted

## 2015-01-21 NOTE — Telephone Encounter (Signed)
  Follow up Call-  Call back number 01/20/2015  Post procedure Call Back phone  # [phone]  Permission to leave phone message Yes     Patient questions:  Number is not in service.

## 2015-01-25 ENCOUNTER — Encounter: Payer: Self-pay | Admitting: Gastroenterology

## 2015-10-26 DIAGNOSIS — E871 Hypo-osmolality and hyponatremia: Secondary | ICD-10-CM | POA: Insufficient documentation

## 2017-05-25 ENCOUNTER — Emergency Department (HOSPITAL_COMMUNITY)
Admission: EM | Admit: 2017-05-25 | Discharge: 2017-05-25 | Disposition: A | Discharge status: Home / Self Care | Payer: Medicaid Other | Attending: Emergency Medicine | Admitting: Emergency Medicine

## 2017-05-25 ENCOUNTER — Emergency Department (HOSPITAL_COMMUNITY): Payer: Medicaid Other

## 2017-05-25 ENCOUNTER — Encounter (HOSPITAL_COMMUNITY): Payer: Self-pay | Admitting: Emergency Medicine

## 2017-05-25 DIAGNOSIS — R1030 Lower abdominal pain, unspecified: Secondary | ICD-10-CM | POA: Insufficient documentation

## 2017-05-25 DIAGNOSIS — F17210 Nicotine dependence, cigarettes, uncomplicated: Secondary | ICD-10-CM | POA: Diagnosis not present

## 2017-05-25 LAB — COMPREHENSIVE METABOLIC PANEL
ALBUMIN: 4.5 g/dL (ref 3.5–5.0)
ALT: 11 U/L — ABNORMAL LOW (ref 17–63)
AST: 18 U/L (ref 15–41)
Alkaline Phosphatase: 61 U/L (ref 38–126)
Anion gap: 10 (ref 5–15)
BILIRUBIN TOTAL: 0.3 mg/dL (ref 0.3–1.2)
BUN: 15 mg/dL (ref 6–20)
CHLORIDE: 107 mmol/L (ref 101–111)
CO2: 26 mmol/L (ref 22–32)
Calcium: 9.6 mg/dL (ref 8.9–10.3)
Creatinine, Ser: 1.02 mg/dL (ref 0.61–1.24)
GFR calc Af Amer: >60 mL/min (ref >60)
GFR calc non Af Amer: >60 mL/min (ref >60)
GLUCOSE: 116 mg/dL — AB (ref 65–99)
POTASSIUM: 3.5 mmol/L (ref 3.5–5.1)
SODIUM: 143 mmol/L (ref 135–145)
Total Protein: 8 g/dL (ref 6.5–8.1)

## 2017-05-25 LAB — I-STAT TROPONIN, ED: Troponin i, poc: 0 ng/mL (ref 0.00–0.08)

## 2017-05-25 LAB — CBC WITH DIFFERENTIAL/PLATELET
Basophils Absolute: 0.1 10*3/uL (ref 0.0–0.1)
Basophils Relative: 1 %
Eosinophils Absolute: 0.3 10*3/uL (ref 0.0–0.7)
Eosinophils Relative: 6 %
HEMATOCRIT: 42.3 % (ref 39.0–52.0)
HEMOGLOBIN: 14 g/dL (ref 13.0–17.0)
LYMPHS ABS: 1.8 10*3/uL (ref 0.7–4.0)
LYMPHS PCT: 33 %
MCH: 33.3 pg (ref 26.0–34.0)
MCHC: 33.1 g/dL (ref 30.0–36.0)
MCV: 100.7 fL — AB (ref 78.0–100.0)
MONOS PCT: 7 %
Monocytes Absolute: 0.4 10*3/uL (ref 0.1–1.0)
NEUTROS ABS: 2.9 10*3/uL (ref 1.7–7.7)
NEUTROS PCT: 53 %
Platelets: 236 10*3/uL (ref 150–400)
RBC: 4.2 MIL/uL — ABNORMAL LOW (ref 4.22–5.81)
RDW: 12.7 % (ref 11.5–15.5)
WBC: 5.4 10*3/uL (ref 4.0–10.5)

## 2017-05-25 LAB — URINALYSIS, ROUTINE W REFLEX MICROSCOPIC
BILIRUBIN URINE: NEGATIVE
GLUCOSE, UA: NEGATIVE mg/dL
Hgb urine dipstick: NEGATIVE
KETONES UR: NEGATIVE mg/dL
Leukocytes, UA: NEGATIVE
Nitrite: NEGATIVE
PH: 7 (ref 5.0–8.0)
Protein, ur: NEGATIVE mg/dL
SPECIFIC GRAVITY, URINE: 1.01 (ref 1.005–1.030)

## 2017-05-25 LAB — POC OCCULT BLOOD, ED: FECAL OCCULT BLD: NEGATIVE

## 2017-05-25 LAB — LIPASE, BLOOD: Lipase: 40 U/L (ref 11–51)

## 2017-05-25 MED ORDER — IOPAMIDOL (ISOVUE-300) INJECTION 61%
100.0000 mL | Freq: Once | INTRAVENOUS | Status: AC | PRN
  Administered 2017-05-25: 100 mL via INTRAVENOUS

## 2017-05-25 NOTE — Discharge Instructions (Signed)
NO cause of your pain was found. Your labs are normal. No blood was seen in your stool Please call your gastroenterologist for follow up asap. Return if you are worse especially worsening pain, fever, or inability to tolerate oral intake.

## 2017-05-25 NOTE — ED Notes (Signed)
Pt understood dc material. NAD noted. 

## 2017-05-25 NOTE — ED Notes (Signed)
Pt returned back to room.

## 2017-05-25 NOTE — ED Provider Notes (Signed)
[facility] EMERGENCY DEPARTMENT Provider Note   CSN: [id number] Arrival date & time: 05/25/17  1639     History   Chief Complaint Chief Complaint  Patient presents with  . Abdominal Pain  . Rectal Bleeding    HPI Frank Patton is a 57 y.o. male.  HPI  57 yo male complaining of lower abdominap pain began 6 months ago and states continues to worsen.  Today states he "can't take it."  Today with nausea x 6 weeks.  Vomited one time today.  Patient states 30 weight loss.    Past Medical History:  Diagnosis Date  . Arthritis    hands  . Depression     Patient Active Problem List   Diagnosis Date Noted  . Encounter for smoking cessation counseling 11/12/2014  . Screening for lipoid disorders 11/12/2014  . Loss of weight 11/12/2014  . Encounter for screening colonoscopy 11/12/2014    Past Surgical History:  Procedure Laterality Date  . BACK SURGERY    . CARPAL TUNNEL RELEASE    . FRACTURE SURGERY     right lower leg  . LEG SURGERY         Home Medications    Prior to Admission medications   Medication Sig Start Date End Date Taking? Authorizing Provider  Aspirin-Acetaminophen-Caffeine (GOODY HEADACHE PO) Take 1 packet by mouth daily as needed. For pain     [provider]    Family History Family History  Problem Relation Age of Onset  . Colon cancer Mother   . Diabetes Mother   . Hypertension Mother   . Throat cancer Father   . Colon cancer Brother   . Diabetes Maternal Grandmother   . Diabetes Maternal Grandfather   . Alcohol abuse Maternal Grandfather   . Stroke Maternal Grandfather   . Cancer Paternal Grandmother   . Coronary artery disease Brother   . Esophageal cancer Neg Hx   . Rectal cancer Neg Hx   . Stomach cancer Neg Hx     Social History Social History   Tobacco Use  . Smoking status: Current Every Day Smoker    Packs/day: 0.75    Years: 30.00    Pack years: 22.50    Types: Cigarettes  . Smokeless tobacco: Current  User    Types: Chew  Substance Use Topics  . Alcohol use: Yes    Alcohol/week: 7.2 oz    Types: 12 Cans of beer per week  . Drug use: Yes    Frequency: 8.0 times per week    Types: Marijuana    Comment: rare- used "within the last week" 01-20-15   Smoker, ddrinks once a week  Allergies   Patient has no known allergies.   Review of Systems Review of Systems  Constitutional: Positive for appetite change and unexpected weight change.  HENT: Negative.   Eyes: Negative.   Respiratory: Negative.   Cardiovascular:       Comes and goes over six weeks  Gastrointestinal: Positive for blood in stool and vomiting.  Genitourinary: Positive for frequency.  Musculoskeletal: Negative.   Allergic/Immunologic: Negative.   Neurological: Negative.   Psychiatric/Behavioral: Negative.      Physical Exam Updated Vital Signs BP (!) 177/92 (BP Location: Right Arm)   Pulse (!) 57   Temp 98.5 F (36.9 C) (Oral)   Resp 18   Ht 1.753 m (5\' 9" )   Wt 64.9 kg (143 lb)   SpO2 100%   BMI 21.12 kg/m   Physical  Exam  Constitutional: He appears well-developed.  HENT:  Head: Normocephalic and atraumatic.  Vitals reviewed.    ED Treatments / Results  Labs (all labs ordered are listed, but only abnormal results are displayed) Labs Reviewed - No data to display  EKG  EKG Interpretation  Date/Time:  Friday May 25 2017 18:06:32 EST Ventricular Rate:  53 PR Interval:    QRS Duration: 102 QT Interval:  406 QTC Calculation: 382 R Axis:   92 Text Interpretation:  Sinus rhythm Borderline right axis deviation Diffuse Non-specific ST-t changes No significant change was found since last tracing 09 July 2001 Confirmed by Pattricia Boss [id number]) on 05/25/2017 6:11:13 PM       Radiology No results found.  Procedures Procedures (including critical care time)  Medications Ordered in ED Medications - No data to display   Initial Impression / Assessment and Plan / ED Course  I have  reviewed the triage vital signs and the nursing notes.  Pertinent labs & imaging results that were available during my care of the patient were reviewed by me and considered in my medical decision making (see chart for details).     Discussed results with patient and stepson and stepson's gf.  Discussed labs, imaging and need for f/u.  Patient reports colonoscopy at [facility] 3 years ago with 7 polyps.   Final Clinical Impressions(s) / ED Diagnoses   Final diagnoses:  Lower abdominal pain    ED Discharge Orders    None       Pattricia Boss, MD 05/25/17 2102

## 2017-05-25 NOTE — ED Triage Notes (Signed)
Pt reports abd pain x 3 months with 2 episodes of bright red rectal bleeding last Friday.  States he also hurts in all of his joints ever since a tick bite in 2017.

## 2017-11-20 ENCOUNTER — Other Ambulatory Visit: Payer: Self-pay

## 2017-11-20 ENCOUNTER — Other Ambulatory Visit: Payer: Self-pay | Admitting: *Deleted

## 2017-11-20 ENCOUNTER — Encounter: Payer: Self-pay | Admitting: Vascular Surgery

## 2017-11-20 ENCOUNTER — Ambulatory Visit (INDEPENDENT_AMBULATORY_CARE_PROVIDER_SITE_OTHER): Payer: Medicaid Other | Admitting: Vascular Surgery

## 2017-11-20 ENCOUNTER — Encounter: Payer: Self-pay | Admitting: *Deleted

## 2017-11-20 ENCOUNTER — Ambulatory Visit (HOSPITAL_COMMUNITY)
Admission: RE | Admit: 2017-11-20 | Discharge: 2017-11-20 | Disposition: A | Discharge status: Home / Self Care | Payer: Medicaid Other | Source: Ambulatory Visit | Attending: Vascular Surgery | Admitting: Vascular Surgery

## 2017-11-20 VITALS — BP 140/90 | HR 66 | Resp 20 | Ht 69.0 in | Wt 145.8 lb

## 2017-11-20 DIAGNOSIS — I739 Peripheral vascular disease, unspecified: Secondary | ICD-10-CM | POA: Insufficient documentation

## 2017-11-20 NOTE — Progress Notes (Signed)
[facility] [place]  Patient name: Frank Patton MRN: [id number] DOB: 1960-05-06 Sex: male  REASON FOR CONSULT: Evaluation of lower extremity claudication  HPI: Frank Patton is a 57 y.o. male, who is here today for discussion of lower extremity claudication symptoms.  He is here today with his son.  He has classic symptoms of thigh and calf claudication with walking.  He reports that if he stops this is relieved and it is worse if he is walking uphill or walking fast toward a great distance.  He has no history of lower extremity tissue loss.  He does report numbness in his feet and I explained that this would not be related to arterial insufficiency.  Also warts nocturnal calf cramping and I explained that this would not be related to arterial insufficiency as well.  He did have a history of severe illness related to the tick bite reports that he was hospitalized for almost 2 months at [facility] regarding this.  I do not have records of this.  He is a cigarette smoker.  Denies any history of cardiac disease.  Not have any arterial rest pain.  Reports this is being going on for several years and has been gotten progressively worse.  Past Medical History:  Diagnosis Date  . Arthritis    hands  . Depression   . Tick bite     Family History  Problem Relation Age of Onset  . Colon cancer Mother   . Diabetes Mother   . Hypertension Mother   . Throat cancer Father   . Colon cancer Brother   . Diabetes Maternal Grandmother   . Diabetes Maternal Grandfather   . Alcohol abuse Maternal Grandfather   . Stroke Maternal Grandfather   . Cancer Paternal Grandmother   . Coronary artery disease Brother   . Esophageal cancer Neg Hx   . Rectal cancer Neg Hx   . Stomach cancer Neg Hx     SOCIAL HISTORY: Social History   Socioeconomic History  . Marital status: Divorced    Spouse name: Not on file  . Number of children: Not on file  .  Years of education: Not on file  . Highest education level: Not on file  Occupational History  . Not on file  Social Needs  . Financial resource strain: Not on file  . Food insecurity:    Worry: Not on file    Inability: Not on file  . Transportation needs:    Medical: Not on file    Non-medical: Not on file  Tobacco Use  . Smoking status: Current Every Day Smoker    Packs/day: 0.75    Years: 30.00    Pack years: 22.50    Types: Cigarettes  . Smokeless tobacco: Current User    Types: Chew  Substance and Sexual Activity  . Alcohol use: Yes    Alcohol/week: 12.0 standard drinks    Types: 12 Cans of beer per week  . Drug use: Yes    Frequency: 8.0 times per week    Types: Marijuana    Comment: rare- used "within the last week" 01-20-15  . Sexual activity: Yes    Partners: Female  Lifestyle  . Physical activity:    Days per week: Not on file    Minutes per session: Not on file  . Stress: Not on file  Relationships  . Social connections:    Talks on phone: Not on file    Gets  together: Not on file    Attends religious service: Not on file    Active member of club or organization: Not on file    Attends meetings of clubs or organizations: Not on file    Relationship status: Not on file  . Intimate partner violence:    Fear of current or ex partner: Not on file    Emotionally abused: Not on file    Physically abused: Not on file    Forced sexual activity: Not on file  Other Topics Concern  . Not on file  Social History Narrative  . Not on file    No Known Allergies  Current Outpatient Medications  Medication Sig Dispense Refill  . Aspirin-Acetaminophen-Caffeine (GOODY HEADACHE PO) Take 1 packet by mouth daily as needed. For pain      No current facility-administered medications for this visit.     REVIEW OF SYSTEMS:  [X]  denotes positive finding, [ ]  denotes negative finding Cardiac  Comments:  Chest pain or chest pressure:    Shortness of breath upon  exertion:    Short of breath when lying flat:    Irregular heart rhythm:        Vascular    Pain in calf, thigh, or hip brought on by ambulation: x   Pain in feet at night that wakes you up from your sleep:  x   Blood clot in your veins:    Leg swelling:  x       Pulmonary    Oxygen at home:    Productive cough:     Wheezing:         Neurologic    Sudden weakness in arms or legs:  x   Sudden numbness in arms or legs:  x   Sudden onset of difficulty speaking or slurred speech:    Temporary loss of vision in one eye:     Problems with dizziness:  x       Gastrointestinal    Blood in stool:     Vomited blood:         Genitourinary    Burning when urinating:     Blood in urine:        Psychiatric    Major depression:         Hematologic    Bleeding problems:    Problems with blood clotting too easily:        Skin    Rashes or ulcers:        Constitutional    Fever or chills:      PHYSICAL EXAM: Vitals:   11/20/17 0834  BP: 140/90  Pulse: 66  Resp: 20  SpO2: 100%  Weight: 145 lb 12.8 oz (66.1 kg)  Height: 5\' 9"  (1.753 m)    GENERAL: The patient is a well-nourished male, in no acute distress. The vital signs are documented above. CARDIOVASCULAR: Carotid arteries without bruits bilaterally.  2+ radial 2+ femoral and 2+ dorsalis pedis pulses bilaterally PULMONARY: There is good air exchange  ABDOMEN: Soft and non-tender no abdominal bruits noted MUSCULOSKELETAL: There are no major deformities or cyanosis. NEUROLOGIC: No focal weakness or paresthesias are detected. SKIN: There are no ulcers or rashes noted. PSYCHIATRIC: The patient has a normal affect.  DATA:  He did undergo noninvasive exercise study today in our office.  This showed normal ankle arm index at rest bilaterally.  He had immediate drop in both his right and left ankle arm index with recovery following rest.  MEDICAL ISSUES: Long discussion with the patient and his son.  I explained that this  is not limb threatening.  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  I explained that this is not limb threatening.  Explained one option would be continued observation.  He reports that he is severely limited by this and is unable to do his routine activities.  He wishes further evaluation.  I did explain that the nocturnal cramping and numbness that he is experiencing in his feet would not be related to arterial insufficiency and not improved if he has intervention.  He reports that the claudication symptoms are severe and wishes correction of this component of his discomfort if possible.  We will schedule him for outpatient arteriography and possible intervention at his earliest [place]. Shelton Square, MD [facility] Tel [phone] Pager [phone]

## 2017-11-20 NOTE — H&P (View-Only) (Signed)
[facility] Specialist of [place]  Patient name: Frank Patton MRN: [id number] DOB: [birth year] Sex: male  REASON FOR CONSULT: Evaluation of lower extremity claudication  HPI: Frank Patton is a 57 y.o. male, who is here today for discussion of lower extremity claudication symptoms.  He is here today with his son.  He has classic symptoms of thigh and calf claudication with walking.  He reports that if he stops this is relieved and it is worse if he is walking uphill or walking fast toward a great distance.  He has no history of lower extremity tissue loss.  He does report numbness in his feet and I explained that this would not be related to arterial insufficiency.  Also warts nocturnal calf cramping and I explained that this would not be related to arterial insufficiency as well.  He did have a history of severe illness related to the tick bite reports that he was hospitalized for almost 2 months at [facility] regarding this.  I do not have records of this.  He is a cigarette smoker.  Denies any history of cardiac disease.  Not have any arterial rest pain.  Reports this is being going on for several years and has been gotten progressively worse.  Past Medical History:  Diagnosis Date  . Arthritis    hands  . Depression   . Tick bite     Family History  Problem Relation Age of Onset  . Colon cancer Mother   . Diabetes Mother   . Hypertension Mother   . Throat cancer Father   . Colon cancer Brother   . Diabetes Maternal Grandmother   . Diabetes Maternal Grandfather   . Alcohol abuse Maternal Grandfather   . Stroke Maternal Grandfather   . Cancer Paternal Grandmother   . Coronary artery disease Brother   . Esophageal cancer Neg Hx   . Rectal cancer Neg Hx   . Stomach cancer Neg Hx     SOCIAL HISTORY: Social History   Socioeconomic History  . Marital status: Divorced    Spouse name: Not on file  . Number of children: Not on file  .  Years of education: Not on file  . Highest education level: Not on file  Occupational History  . Not on file  Social Needs  . Financial resource strain: Not on file  . Food insecurity:    Worry: Not on file    Inability: Not on file  . Transportation needs:    Medical: Not on file    Non-medical: Not on file  Tobacco Use  . Smoking status: Current Every Day Smoker    Packs/day: 0.75    Years: 30.00    Pack years: 22.50    Types: Cigarettes  . Smokeless tobacco: Current User    Types: Chew  Substance and Sexual Activity  . Alcohol use: Yes    Alcohol/week: 12.0 standard drinks    Types: 12 Cans of beer per week  . Drug use: Yes    Frequency: 8.0 times per week    Types: Marijuana    Comment: rare- used "within the last week" 01-20-15  . Sexual activity: Yes    Partners: Female  Lifestyle  . Physical activity:    Days per week: Not on file    Minutes per session: Not on file  . Stress: Not on file  Relationships  . Social connections:    Talks on phone: Not on file    Gets  together: Not on file    Attends religious service: Not on file    Active member of club or organization: Not on file    Attends meetings of clubs or organizations: Not on file    Relationship status: Not on file  . Intimate partner violence:    Fear of current or ex partner: Not on file    Emotionally abused: Not on file    Physically abused: Not on file    Forced sexual activity: Not on file  Other Topics Concern  . Not on file  Social History Narrative  . Not on file    No Known Allergies  Current Outpatient Medications  Medication Sig Dispense Refill  . Aspirin-Acetaminophen-Caffeine (GOODY HEADACHE PO) Take 1 packet by mouth daily as needed. For pain      No current facility-administered medications for this visit.     REVIEW OF SYSTEMS:  [X]  denotes positive finding, [ ]  denotes negative finding Cardiac  Comments:  Chest pain or chest pressure:    Shortness of breath upon  exertion:    Short of breath when lying flat:    Irregular heart rhythm:        Vascular    Pain in calf, thigh, or hip brought on by ambulation: x   Pain in feet at night that wakes you up from your sleep:  x   Blood clot in your veins:    Leg swelling:  x       Pulmonary    Oxygen at home:    Productive cough:     Wheezing:         Neurologic    Sudden weakness in arms or legs:  x   Sudden numbness in arms or legs:  x   Sudden onset of difficulty speaking or slurred speech:    Temporary loss of vision in one eye:     Problems with dizziness:  x       Gastrointestinal    Blood in stool:     Vomited blood:         Genitourinary    Burning when urinating:     Blood in urine:        Psychiatric    Major depression:         Hematologic    Bleeding problems:    Problems with blood clotting too easily:        Skin    Rashes or ulcers:        Constitutional    Fever or chills:      PHYSICAL EXAM: Vitals:   11/20/17 0834  BP: 140/90  Pulse: 66  Resp: 20  SpO2: 100%  Weight: 145 lb 12.8 oz (66.1 kg)  Height: 5\' 9"  (1.753 m)    GENERAL: The patient is a well-nourished male, in no acute distress. The vital signs are documented above. CARDIOVASCULAR: Carotid arteries without bruits bilaterally.  2+ radial 2+ femoral and 2+ dorsalis pedis pulses bilaterally PULMONARY: There is good air exchange  ABDOMEN: Soft and non-tender no abdominal bruits noted MUSCULOSKELETAL: There are no major deformities or cyanosis. NEUROLOGIC: No focal weakness or paresthesias are detected. SKIN: There are no ulcers or rashes noted. PSYCHIATRIC: The patient has a normal affect.  DATA:  He did undergo noninvasive exercise study today in our office.  This showed normal ankle arm index at rest bilaterally.  He had immediate drop in both his right and left ankle arm index with recovery following rest.  MEDICAL ISSUES: Long discussion with the patient and his son.  I explained that this  is not limb threatening.  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  I explained that this is not limb threatening.  Explained one option would be continued observation.  He reports that he is severely limited by this and is unable to do his routine activities.  He wishes further evaluation.  I did explain that the nocturnal cramping and numbness that he is experiencing in his feet would not be related to arterial insufficiency and not improved if he has intervention.  He reports that the claudication symptoms are severe and wishes correction of this component of his discomfort if possible.  We will schedule him for outpatient arteriography and possible intervention at his earliest [place]. Shirl Weir, MD [facility] and Ve[facility] Specialists of [facility] Tel [phone] Pager [phone]

## 2017-11-23 ENCOUNTER — Other Ambulatory Visit: Payer: Self-pay

## 2017-11-23 ENCOUNTER — Encounter (HOSPITAL_COMMUNITY)
Admission: RE | Disposition: A | Discharge status: Home / Self Care | Payer: Self-pay | Source: Ambulatory Visit | Attending: Vascular Surgery

## 2017-11-23 ENCOUNTER — Ambulatory Visit (HOSPITAL_COMMUNITY)
Admission: RE | Admit: 2017-11-23 | Discharge: 2017-11-23 | Disposition: A | Discharge status: Home / Self Care | Payer: Medicaid Other | Source: Ambulatory Visit | Attending: Vascular Surgery | Admitting: Vascular Surgery

## 2017-11-23 DIAGNOSIS — I739 Peripheral vascular disease, unspecified: Secondary | ICD-10-CM | POA: Insufficient documentation

## 2017-11-23 DIAGNOSIS — M79605 Pain in left leg: Secondary | ICD-10-CM | POA: Insufficient documentation

## 2017-11-23 DIAGNOSIS — M79604 Pain in right leg: Secondary | ICD-10-CM | POA: Diagnosis not present

## 2017-11-23 DIAGNOSIS — F329 Major depressive disorder, single episode, unspecified: Secondary | ICD-10-CM | POA: Diagnosis not present

## 2017-11-23 DIAGNOSIS — F17210 Nicotine dependence, cigarettes, uncomplicated: Secondary | ICD-10-CM | POA: Insufficient documentation

## 2017-11-23 DIAGNOSIS — M1990 Unspecified osteoarthritis, unspecified site: Secondary | ICD-10-CM | POA: Insufficient documentation

## 2017-11-23 DIAGNOSIS — I70213 Atherosclerosis of native arteries of extremities with intermittent claudication, bilateral legs: Secondary | ICD-10-CM

## 2017-11-23 HISTORY — PX: ABDOMINAL AORTOGRAM W/LOWER EXTREMITY: CATH118223

## 2017-11-23 LAB — POCT I-STAT, CHEM 8
BUN: 22 mg/dL — AB (ref 6–20)
BUN: 21 mg/dL — ABNORMAL HIGH (ref 6–20)
CHLORIDE: 108 mmol/L (ref 98–111)
CHLORIDE: 109 mmol/L (ref 98–111)
CREATININE: 1.1 mg/dL (ref 0.61–1.24)
CREATININE: 1.1 mg/dL (ref 0.61–1.24)
Calcium, Ion: 1.04 mmol/L — ABNORMAL LOW (ref 1.15–1.40)
Calcium, Ion: 1.05 mmol/L — ABNORMAL LOW (ref 1.15–1.40)
GLUCOSE: 102 mg/dL — AB (ref 70–99)
GLUCOSE: 102 mg/dL — AB (ref 70–99)
HCT: 43 % (ref 39.0–52.0)
HEMATOCRIT: 43 % (ref 39.0–52.0)
Hemoglobin: 14.6 g/dL (ref 13.0–17.0)
Hemoglobin: 14.6 g/dL (ref 13.0–17.0)
POTASSIUM: 7 mmol/L — AB (ref 3.5–5.1)
Potassium: 6.9 mmol/L (ref 3.5–5.1)
Sodium: 139 mmol/L (ref 135–145)
Sodium: 137 mmol/L (ref 135–145)
TCO2: 26 mmol/L (ref 22–32)
TCO2: 24 mmol/L (ref 22–32)

## 2017-11-23 LAB — POTASSIUM: Potassium: 4.1 mmol/L (ref 3.5–5.1)

## 2017-11-23 SURGERY — ABDOMINAL AORTOGRAM W/LOWER EXTREMITY
Anesthesia: LOCAL

## 2017-11-23 MED ORDER — NITROGLYCERIN 1 MG/10 ML FOR IR/CATH LAB
INTRA_ARTERIAL | Status: DC | PRN
  Administered 2017-11-23: 200 ug via INTRA_ARTERIAL

## 2017-11-23 MED ORDER — ONDANSETRON HCL 4 MG/2ML IJ SOLN: 4.0000 mg | Freq: Four times a day (QID) | INTRAMUSCULAR | Status: DC | PRN

## 2017-11-23 MED ORDER — HEPARIN (PORCINE) IN NACL 1000-0.9 UT/500ML-% IV SOLN
INTRAVENOUS | Status: DC | PRN
  Administered 2017-11-23 (×2): 500 mL

## 2017-11-23 MED ORDER — HEPARIN (PORCINE) IN NACL 1000-0.9 UT/500ML-% IV SOLN
INTRAVENOUS | Status: AC
  Filled 2017-11-23: qty 1000

## 2017-11-23 MED ORDER — NITROGLYCERIN 1 MG/10 ML FOR IR/CATH LAB
INTRA_ARTERIAL | Status: AC
  Filled 2017-11-23: qty 10

## 2017-11-23 MED ORDER — SODIUM CHLORIDE 0.9% FLUSH: 3.0000 mL | INTRAVENOUS | Status: DC | PRN

## 2017-11-23 MED ORDER — LIDOCAINE HCL (PF) 1 % IJ SOLN
INTRAMUSCULAR | Status: DC | PRN
  Administered 2017-11-23: 15 mL via INTRADERMAL

## 2017-11-23 MED ORDER — MORPHINE SULFATE (PF) 10 MG/ML IV SOLN: 2.0000 mg | INTRAVENOUS | Status: DC | PRN

## 2017-11-23 MED ORDER — LIDOCAINE HCL (PF) 1 % IJ SOLN
INTRAMUSCULAR | Status: AC
  Filled 2017-11-23: qty 30

## 2017-11-23 MED ORDER — SODIUM CHLORIDE 0.9% FLUSH: 3.0000 mL | Freq: Two times a day (BID) | INTRAVENOUS | Status: DC

## 2017-11-23 MED ORDER — IODIXANOL 320 MG/ML IV SOLN
INTRAVENOUS | Status: DC | PRN
  Administered 2017-11-23: 225 mL via INTRAVENOUS

## 2017-11-23 MED ORDER — OXYCODONE HCL 5 MG PO TABS: 5.0000 mg | ORAL_TABLET | ORAL | Status: DC | PRN

## 2017-11-23 MED ORDER — SODIUM CHLORIDE 0.9 % IV SOLN
INTRAVENOUS | Status: DC
  Administered 2017-11-23: 09:00:00 via INTRAVENOUS

## 2017-11-23 MED ORDER — LABETALOL HCL 5 MG/ML IV SOLN: 10.0000 mg | INTRAVENOUS | Status: DC | PRN

## 2017-11-23 MED ORDER — ACETAMINOPHEN 325 MG PO TABS: 650.0000 mg | ORAL_TABLET | ORAL | Status: DC | PRN

## 2017-11-23 MED ORDER — HYDRALAZINE HCL 20 MG/ML IJ SOLN: 5.0000 mg | INTRAMUSCULAR | Status: DC | PRN

## 2017-11-23 MED ORDER — SODIUM CHLORIDE 0.9 % IV SOLN: 250.0000 mL | INTRAVENOUS | Status: DC | PRN

## 2017-11-23 MED ORDER — SODIUM CHLORIDE 0.9 % IV SOLN: INTRAVENOUS | Status: AC

## 2017-11-23 SURGICAL SUPPLY — 8 items
CATH ANGIO 5F PIGTAIL 65CM (CATHETERS) ×1 IMPLANT
CATH STRAIGHT 5FR 65CM (CATHETERS) ×1 IMPLANT
KIT PV (KITS) ×2 IMPLANT
SHEATH PINNACLE 5F 10CM (SHEATH) ×1 IMPLANT
SYR MEDRAD MARK V 150ML (SYRINGE) ×2 IMPLANT
TRANSDUCER W/STOPCOCK (MISCELLANEOUS) ×2 IMPLANT
TRAY PV CATH (CUSTOM PROCEDURE TRAY) ×2 IMPLANT
WIRE HITORQ VERSACORE ST 145CM (WIRE) ×1 IMPLANT

## 2017-11-23 NOTE — Progress Notes (Signed)
Pt received - r dp +2 palpable- pt advised if after sheath is removed, h laughs, coughs, sneezes or bears down he must hold pressure above the "stick site" and if he feels anything wet, warm, sticky he must alert the staff immediately. Pt verbalizes understanding as he repeats back to me. 5 fr sheath removed from rfa()level 0), manual pressure held x 35 min with hemostasis achieved- pt advised several times during hold to keep head on pillow and r leg straight as this exerts pressure and could create issues with bleeding. Pt verbally acknowledged understanding as he repeats this back each time he is reminded. Pt is stable with no hematoma, ecchymosis or bleeding and r dp +2 post sheath removal.

## 2017-11-23 NOTE — Interval H&P Note (Signed)
History and Physical Interval Note:  11/23/2017 10:31 AM  Frank Patton  has presented today for surgery, with the diagnosis of pvd  The various methods of treatment have been discussed with the patient and family. After consideration of risks, benefits and other options for treatment, the patient has consented to  Procedure(s): ABDOMINAL AORTOGRAM W/LOWER EXTREMITY (N/A) as a surgical intervention .  The patient's history has been reviewed, patient examined, no change in status, stable for surgery.  I have reviewed the patient's chart and labs.  Questions were answered to the patient's satisfaction.     Ruta Hinds

## 2017-11-23 NOTE — Op Note (Addendum)
Procedure: Abdominal aortogram with bilateral lower extremity runoff, aortoiliac pullback pressures right leg, ultrasound right groin  Preoperative diagnosis: Bilateral leg pain  Postoperative diagnosis: Same  Anesthesia: Local  Operative findings: No significant aortoiliac or lower extremity arterial occlusive disease, slow arterial flow bilaterally  Operative details: After obtaining informed consent, patient was taken the PV lab.  The patient was placed in supine position Angio table.  Both groins were prepped and draped in usual sterile fashion.  Ultrasound was used to identify the right common femoral artery and femoral bifurcation.  These were all patent.  A permanent copy image was obtained and placed in the patient's medical record.  Using ultrasound guidance local anesthesia was infiltrated over the common femoral artery.  The artery was then cannulated under ultrasound guidance and an 035 versacore wire threaded up in the abdominal aorta under fluoroscopic guidance.  Next a 5 French sheath was placed over the guidewire in the right common femoral artery.  This was thoroughly flushed with heparinized saline.  A 5 French pigtail catheter was then placed over the guidewire into the abdominal aorta and abdominal aortogram was obtained in AP projection.  The infrarenal abdominal aorta is widely patent.  The left renal left and right renal arteries are widely patent.  The left and right common external and internal iliac arteries are all patent.  There is some haziness due to overlying bowel gas.  Therefore the pico catheter was pulled down just above the aortic bifurcation and bilateral oblique views of the pelvis were obtained.  There was still no significant narrowing although a suggestion of narrowing at the iliac bifurcation on the right side.  Bilateral lower extremity runoff views were then obtained through the pigtail catheter.  The left lower extremity, the left common femoral profunda  femoris superficial femoral-popliteal artery anterior tibial artery posterior tibial artery and radial arteries are all widely patent although it is sluggish flow taking almost 30 to 45 seconds for the runoff to complete from the aorta.  In the right lower extremity the right common femoral artery profunda femoris visual femoral-popliteal artery is widely patent.  There is very sluggish flow down the tibial arteries but all 3 appear to be present although it was difficult to image some of these due to sluggish flow of contrast.  This was despite even injecting directly through the right femoral sheath.  At this point pullback pressures were performed in the right leg.  The pigtail catheter was pulled back over guidewire and replaced with a 5 French straight catheter.  200 mcg nitroglycerin was injected down the right common femoral artery.  I did obtain pullback pressures and there was no significant gradient across the right iliac system entirely.  At this point the procedure was concluded.  The sheath was left in place to be pulled in the holding area.  The patient tired procedure well and there were no complications.  Operative management: No significant aortoiliac or lower extremity arterial occlusive disease.  The patient will be referred back to the primary care physician for further management and evaluation for his leg pain.  Ruta Hinds, MD [facility] of [place] Office: [phone] Pager: [phone]

## 2017-11-23 NOTE — Discharge Instructions (Signed)
Drink plenty of fluids over next 48 hours ° °Femoral Site Care °Refer to this sheet in the next few weeks. These instructions provide you with information about caring for yourself after your procedure. Your health care provider may also give you more specific instructions. Your treatment has been planned according to current medical practices, but problems sometimes occur. Call your health care provider if you have any problems or questions after your procedure. °What can I expect after the procedure? °After your procedure, it is typical to have the following: °· Bruising at the site that usually fades within 1-2 weeks. °· Blood collecting in the tissue (hematoma) that may be painful to the touch. It should usually decrease in size and tenderness within 1-2 weeks. ° °Follow these instructions at home: °· Take medicines only as directed by your health care provider. °· You may shower 24-48 hours after the procedure or as directed by your health care provider. Remove the bandage (dressing) and gently wash the site with plain soap and water. Pat the area dry with a clean towel. Do not rub the site, because this may cause bleeding. °· Do not take baths, swim, or use a hot tub until your health care provider approves. °· Check your insertion site every day for redness, swelling, or drainage. °· Do not apply powder or lotion to the site. °· Limit use of stairs to twice a day for the first 2-3 days or as directed by your health care provider. °· Do not squat for the first 2-3 days or as directed by your health care provider. °· Do not lift over 10 lb (4.5 kg) for 5 days after your procedure or as directed by your health care provider. °· Ask your health care provider when it is okay to: °? Return to work or school. °? Resume usual physical activities or sports. °? Resume sexual activity. °· Do not drive home if you are discharged the same day as the procedure. Have someone else drive you. °· You may drive 24 hours after  the procedure unless otherwise instructed by your health care provider. °· Do not operate machinery or power tools for 24 hours after the procedure or as directed by your health care provider. °· If your procedure was done as an outpatient procedure, which means that you went home the same day as your procedure, a responsible adult should be with you for the first 24 hours after you arrive home. °· Keep all follow-up visits as directed by your health care provider. This is important. °Contact a health care provider if: °· You have a fever. °· You have chills. °· You have increased bleeding from the site. Hold pressure on the site. °Get help right away if: °· You have unusual pain at the site. °· You have redness, warmth, or swelling at the site. °· You have drainage (other than a small amount of blood on the dressing) from the site. °· The site is bleeding, and the bleeding does not stop after 30 minutes of holding steady pressure on the site. °· Your leg or foot becomes pale, cool, tingly, or numb. °This information is not intended to replace advice given to you by your health care provider. Make sure you discuss any questions you have with your health care provider. °Document Released: 11/14/2013 Document Revised: 08/19/2015 Document Reviewed: 09/30/2013 °Elsevier Interactive Patient Education © 2018 [employer] ° °

## 2017-11-26 ENCOUNTER — Encounter (HOSPITAL_COMMUNITY): Payer: Self-pay | Admitting: Vascular Surgery

## 2018-01-21 ENCOUNTER — Encounter: Payer: Self-pay | Admitting: Gastroenterology

## 2018-04-09 ENCOUNTER — Ambulatory Visit: Payer: Medicaid Other | Admitting: Family Medicine

## 2018-04-09 ENCOUNTER — Encounter: Payer: Self-pay | Admitting: Family Medicine

## 2018-04-09 VITALS — BP 130/89 | HR 63 | Temp 97.3°F | Ht 69.0 in | Wt 149.0 lb

## 2018-04-09 DIAGNOSIS — F1010 Alcohol abuse, uncomplicated: Secondary | ICD-10-CM

## 2018-04-09 DIAGNOSIS — R531 Weakness: Secondary | ICD-10-CM

## 2018-04-09 DIAGNOSIS — K2100 Gastro-esophageal reflux disease with esophagitis, without bleeding: Secondary | ICD-10-CM | POA: Insufficient documentation

## 2018-04-09 DIAGNOSIS — K2960 Other gastritis without bleeding: Secondary | ICD-10-CM | POA: Diagnosis not present

## 2018-04-09 DIAGNOSIS — R202 Paresthesia of skin: Secondary | ICD-10-CM | POA: Diagnosis not present

## 2018-04-09 DIAGNOSIS — T39395A Adverse effect of other nonsteroidal anti-inflammatory drugs [NSAID], initial encounter: Secondary | ICD-10-CM

## 2018-04-09 HISTORY — DX: Alcohol abuse, uncomplicated: F10.10

## 2018-04-09 MED ORDER — PANTOPRAZOLE SODIUM 40 MG PO TBEC: 40.0000 mg | DELAYED_RELEASE_TABLET | Freq: Every day | ORAL | 2 refills | Status: AC

## 2018-04-09 NOTE — Patient Instructions (Signed)

## 2018-04-09 NOTE — Progress Notes (Signed)
Subjective:    Patient ID: Frank Patton, male    DOB: May 18, 1960, 58 y.o.   MRN: [id number]  Chief Complaint:  Abdominal Pain (pain and burning in stomach) and Leg cramps   HPI: Frank Patton is a 58 y.o. male presenting on 04/09/2018 for Abdominal Pain (pain and burning in stomach) and Leg cramps  Pt presents today to reestablish care and for epigastric pain and paresthesias in his hands and feet. Pt reports he has not seen a primary care provider in a long time and needs to get reestablished.   Pt reports the epigastric pain started several months ago. States he was taking at least 4 Goody[employer] Powders per day for several months. States after the epigastric pain became worse he quit taking the Goody's Powder. States he stopped this about a month ago but the epigastric pain continues. States he has symptoms on a daily basis. States the pain is worsened by food. States he does have a cough with the pain. Denies hemoptysis, blood in stool, dark tarry stools, or changes in bowel habits. He denies fever, chills, or weight loss. States he does have generalized fatigue. He does drink alcohol on a regular basis. He has not tried anything for the symptoms. He reports regular bowel habits.  Pt states he has also been experiencing numbness and tingling in his hands and feet. He reports this is intermittent. States he has not had a loss of function. Denies injury. States this has been ongoing for several weeks.   Relevant past medical, surgical, family, and social history reviewed and updated as indicated.  Allergies and medications reviewed and updated.   Past Medical History:  Diagnosis Date  . Alcohol abuse, continuous 04/09/2018  . Arthritis    hands  . Depression   . Tick bite     Past Surgical History:  Procedure Laterality Date  . ABDOMINAL AORTOGRAM W/LOWER EXTREMITY N/A 11/23/2017   Procedure: ABDOMINAL AORTOGRAM W/LOWER EXTREMITY;  Surgeon: Elam Dutch, MD;  Location: [facility];  Service: Cardiovascular;  Laterality: N/A;  . BACK SURGERY    . CARPAL TUNNEL RELEASE    . FRACTURE SURGERY     right lower leg  . LEG SURGERY      Social History   Socioeconomic History  . Marital status: Divorced    Spouse name: Not on file  . Number of children: Not on file  . Years of education: Not on file  . Highest education level: Not on file  Occupational History  . Not on file  Social Needs  . Financial resource strain: Not on file  . Food insecurity:    Worry: Not on file    Inability: Not on file  . Transportation needs:    Medical: Not on file    Non-medical: Not on file  Tobacco Use  . Smoking status: Current Every Day Smoker    Packs/day: 0.75    Years: 30.00    Pack years: 22.50    Types: Cigarettes  . Smokeless tobacco: Current User    Types: Chew  Substance and Sexual Activity  . Alcohol use: Yes    Alcohol/week: 12.0 standard drinks    Types: 12 Cans of beer per week  . Drug use: Yes    Frequency: 8.0 times per week    Types: Marijuana    Comment: rare- used "within the last week" 01-20-15  . Sexual activity: Yes    Partners: Female  Lifestyle  .  Physical activity:    Days per week: Not on file    Minutes per session: Not on file  . Stress: Not on file  Relationships  . Social connections:    Talks on phone: Not on file    Gets together: Not on file    Attends religious service: Not on file    Active member of club or organization: Not on file    Attends meetings of clubs or organizations: Not on file    Relationship status: Not on file  . Intimate partner violence:    Fear of current or ex partner: Not on file    Emotionally abused: Not on file    Physically abused: Not on file    Forced sexual activity: Not on file  Other Topics Concern  . Not on file  Social History Narrative  . Not on file    Outpatient Encounter Medications as of 04/09/2018  Medication Sig  . pantoprazole (PROTONIX) 40 MG tablet Take 1  tablet (40 mg total) by mouth daily.  . [DISCONTINUED] Aspirin-Acetaminophen-Caffeine (GOODY HEADACHE PO) Take 1 packet by mouth daily as needed. For pain    No facility-administered encounter medications on file as of 04/09/2018.     No Known Allergies  Review of Systems  Constitutional: Positive for fatigue. Negative for activity change, appetite change, chills, fever and unexpected weight change.  Eyes: Negative for photophobia and visual disturbance.  Respiratory: Negative for chest tightness and shortness of breath.   Cardiovascular: Negative for chest pain, palpitations and leg swelling.  Gastrointestinal: Positive for abdominal pain (epigastric). Negative for abdominal distention, anal bleeding, blood in stool, constipation, diarrhea, nausea, rectal pain and vomiting.  Endocrine: Negative for polydipsia, polyphagia and polyuria.  Genitourinary: Negative for decreased urine volume, difficulty urinating, dysuria, enuresis, flank pain, frequency, hematuria and urgency.  Musculoskeletal: Negative for arthralgias, myalgias, neck pain and neck stiffness.  Skin: Negative for color change, pallor, rash and wound.  Neurological: Positive for weakness (generalized) and numbness (bilateral hands and feet). Negative for dizziness, tremors, seizures, syncope, facial asymmetry, speech difficulty, light-headedness and headaches.  Psychiatric/Behavioral: Negative for confusion.  All other systems reviewed and are negative.       Objective:    BP 130/89   Pulse 63   Temp (!) 97.3 F (36.3 C) (Oral)   Ht '5\' 9"'$  (1.753 m)   Wt 149 lb (67.6 kg)   BMI 22.00 kg/m    Wt Readings from Last 3 Encounters:  04/09/18 149 lb (67.6 kg)  11/23/17 143 lb (64.9 kg)  11/20/17 145 lb 12.8 oz (66.1 kg)    Physical Exam Vitals signs and nursing note reviewed.  Constitutional:      General: He is not in acute distress.    Appearance: He is well-developed and well-groomed. He is not ill-appearing or  toxic-appearing.  HENT:     Head: Normocephalic and atraumatic.     Right Ear: Tympanic membrane, ear canal and external ear normal.     Left Ear: Tympanic membrane, ear canal and external ear normal.     Nose: Nose normal.     Mouth/Throat:     Mouth: Mucous membranes are moist.     Pharynx: Oropharynx is clear. No oropharyngeal exudate or posterior oropharyngeal erythema.  Eyes:     General: Lids are normal.     Extraocular Movements: Extraocular movements intact.     Conjunctiva/sclera: Conjunctivae normal.     Pupils: Pupils are equal, round, and reactive to light.  Neck:     Musculoskeletal: Neck supple.     Thyroid: No thyroid mass, thyromegaly or thyroid tenderness.     Vascular: Normal carotid pulses. No carotid bruit or JVD.  Cardiovascular:     Rate and Rhythm: Normal rate and regular rhythm.     Pulses: Normal pulses.     Heart sounds: Normal heart sounds. No murmur. No friction rub. No gallop.   Pulmonary:     Effort: Pulmonary effort is normal. No respiratory distress.     Breath sounds: Normal breath sounds.  Abdominal:     General: Bowel sounds are normal. There is no distension or abdominal bruit.     Palpations: Abdomen is soft.     Tenderness: There is abdominal tenderness in the epigastric area. There is no right CVA tenderness, left CVA tenderness, guarding or rebound. Negative signs include Murphy's sign and McBurney's sign.     Hernia: No hernia is present.  Lymphadenopathy:     Cervical: No cervical adenopathy.  Skin:    General: Skin is warm and dry.     Capillary Refill: Capillary refill takes less than 2 seconds.     Coloration: Skin is not ashen, cyanotic, jaundiced, mottled or pale.  Neurological:     General: No focal deficit present.     Mental Status: He is alert and oriented to person, place, and time.     Cranial Nerves: Cranial nerves are intact.     Sensory: Sensation is intact.     Motor: Motor function is intact.     Deep Tendon Reflexes:  Reflexes are normal and symmetric.  Psychiatric:        Mood and Affect: Mood normal.        Behavior: Behavior normal. Behavior is cooperative.        Thought Content: Thought content normal.        Judgment: Judgment normal.     Results for orders placed or performed during the hospital encounter of 11/23/17  Potassium  Result Value Ref Range   Potassium 4.1 3.5 - 5.1 mmol/L  I-STAT, chem 8  Result Value Ref Range   Sodium 139 135 - 145 mmol/L   Potassium 6.9 (HH) 3.5 - 5.1 mmol/L   Chloride 108 98 - 111 mmol/L   BUN 22 (H) 6 - 20 mg/dL   Creatinine, Ser 1.10 0.61 - 1.24 mg/dL   Glucose, Bld 102 (H) 70 - 99 mg/dL   Calcium, Ion 1.04 (L) 1.15 - 1.40 mmol/L   TCO2 26 22 - 32 mmol/L   Hemoglobin 14.6 13.0 - 17.0 g/dL   HCT 43.0 39.0 - 52.0 %   Comment NOTIFIED PHYSICIAN   I-STAT, chem 8  Result Value Ref Range   Sodium 137 135 - 145 mmol/L   Potassium 7.0 (HH) 3.5 - 5.1 mmol/L   Chloride 109 98 - 111 mmol/L   BUN 21 (H) 6 - 20 mg/dL   Creatinine, Ser 1.10 0.61 - 1.24 mg/dL   Glucose, Bld 102 (H) 70 - 99 mg/dL   Calcium, Ion 1.05 (L) 1.15 - 1.40 mmol/L   TCO2 24 22 - 32 mmol/L   Hemoglobin 14.6 13.0 - 17.0 g/dL   HCT 43.0 39.0 - 52.0 %       Pertinent labs & imaging results that were available during my care of the patient were reviewed by me and considered in my medical decision making.  Assessment & Plan:  Ethyn was seen today for abdominal pain and leg  cramps.  Diagnoses and all orders for this visit:  Weakness generalized Labs pending. Report any new or worsening symptoms.  -     CBC with Differential/Platelet -     CMP14+EGFR -     Vitamin B12 -     Folate -     TSH  Paresthesia of both hands Possible B12 deficiency due to alcohol abuse. Labs pending. Pt to report any new or worsening symptoms.  -     CBC with Differential/Platelet -     CMP14+EGFR -     Vitamin B12 -     Folate -     TSH  Paresthesia of both feet Possible B12 deficiency due to  alcohol abuse. Labs pending. Pt to report any new or worsening symptoms.  -     CBC with Differential/Platelet -     CMP14+EGFR -     Vitamin B12 -     Folate -     TSH  Gastritis due to nonsteroidal anti-inflammatory drug (NSAID) Avoid NSAIDS and aggravating foods and beverages. Will treat with 8 weeks of Protonix. If no improvement after 6-8 weeks, will refer to GI.  -     pantoprazole (PROTONIX) 40 MG tablet; Take 1 tablet (40 mg total) by mouth daily. -     CBC with Differential/Platelet  Alcohol abuse, continuous Declines treatment at this time. Discussed the importance of cessation. Discussed the importance of adequate nutrition.    Will make an appointment for a CPE.  Continue all other maintenance medications.  Follow up plan: Return in about 6 weeks (around 05/21/2018), or if symptoms worsen or fail to improve, for GERD and CPE.  Educational handout given for GERD  The above assessment and management plan was discussed with the patient. The patient verbalized understanding of and has agreed to the management plan. Patient is aware to call the clinic if symptoms persist or worsen. Patient is aware when to return to the clinic for a follow-up visit. Patient educated on when it is appropriate to go to the emergency department.   Monia Pouch, FNP-C [facility] [phone]

## 2018-04-10 LAB — CMP14+EGFR
ALBUMIN: 4.8 g/dL (ref 3.5–5.5)
ALK PHOS: 79 IU/L (ref 39–117)
ALT: 14 IU/L (ref 0–44)
AST: 13 IU/L (ref 0–40)
Albumin/Globulin Ratio: 1.7 (ref 1.2–2.2)
BUN / CREAT RATIO: 19 (ref 9–20)
BUN: 24 mg/dL (ref 6–24)
Bilirubin Total: 0.3 mg/dL (ref 0.0–1.2)
CO2: 23 mmol/L (ref 20–29)
CREATININE: 1.27 mg/dL (ref 0.76–1.27)
Calcium: 9.7 mg/dL (ref 8.7–10.2)
Chloride: 103 mmol/L (ref 96–106)
GFR calc Af Amer: 73 mL/min/{1.73_m2} (ref >59)
GFR calc non Af Amer: 63 mL/min/{1.73_m2} (ref >59)
GLUCOSE: 100 mg/dL — AB (ref 65–99)
Globulin, Total: 2.8 g/dL (ref 1.5–4.5)
Potassium: 4.9 mmol/L (ref 3.5–5.2)
Sodium: 141 mmol/L (ref 134–144)
Total Protein: 7.6 g/dL (ref 6.0–8.5)

## 2018-04-10 LAB — CBC WITH DIFFERENTIAL/PLATELET
BASOS ABS: 0.1 10*3/uL (ref 0.0–0.2)
Basos: 2 %
EOS (ABSOLUTE): 0.3 10*3/uL (ref 0.0–0.4)
Eos: 4 %
Hematocrit: 43.2 % (ref 37.5–51.0)
Hemoglobin: 14.7 g/dL (ref 13.0–17.7)
IMMATURE GRANULOCYTES: 0 %
Immature Grans (Abs): 0 10*3/uL (ref 0.0–0.1)
LYMPHS ABS: 1.9 10*3/uL (ref 0.7–3.1)
LYMPHS: 33 %
MCH: 33.7 pg — ABNORMAL HIGH (ref 26.6–33.0)
MCHC: 34 g/dL (ref 31.5–35.7)
MCV: 99 fL — ABNORMAL HIGH (ref 79–97)
Monocytes Absolute: 0.5 10*3/uL (ref 0.1–0.9)
Monocytes: 8 %
NEUTROS PCT: 53 %
Neutrophils Absolute: 3.2 10*3/uL (ref 1.4–7.0)
Platelets: 265 10*3/uL (ref 150–450)
RBC: 4.36 x10E6/uL (ref 4.14–5.80)
RDW: 12.4 % (ref 11.6–15.4)
WBC: 5.9 10*3/uL (ref 3.4–10.8)

## 2018-04-10 LAB — FOLATE: Folate: 10.8 ng/mL (ref >3.0)

## 2018-04-10 LAB — VITAMIN B12: VITAMIN B 12: 296 pg/mL (ref 232–1245)

## 2018-04-10 LAB — TSH: TSH: 2.9 u[IU]/mL (ref 0.450–4.500)

## 2018-08-09 IMAGING — DX DG CHEST 2V
3 series · 3 of 3 positions shown · non-contrast
Comparison: None.

CLINICAL DATA: Abdominal pain

EXAM:
CHEST  2 VIEW

[chest lat (1 of 2)]
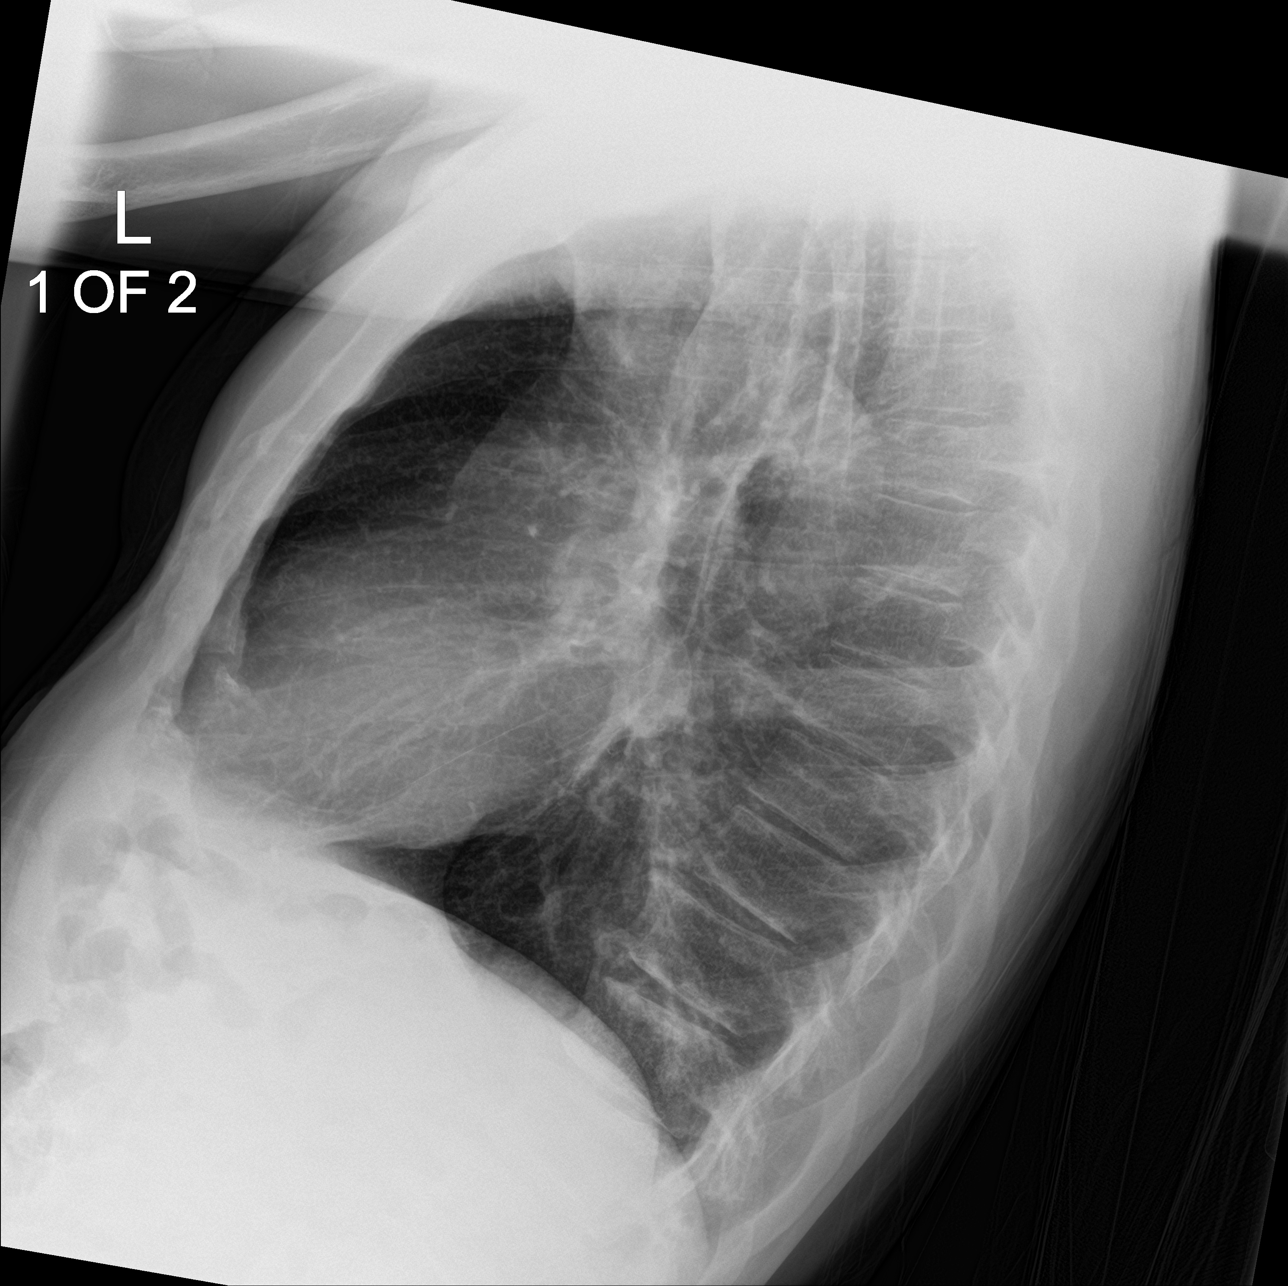

[chest ap]
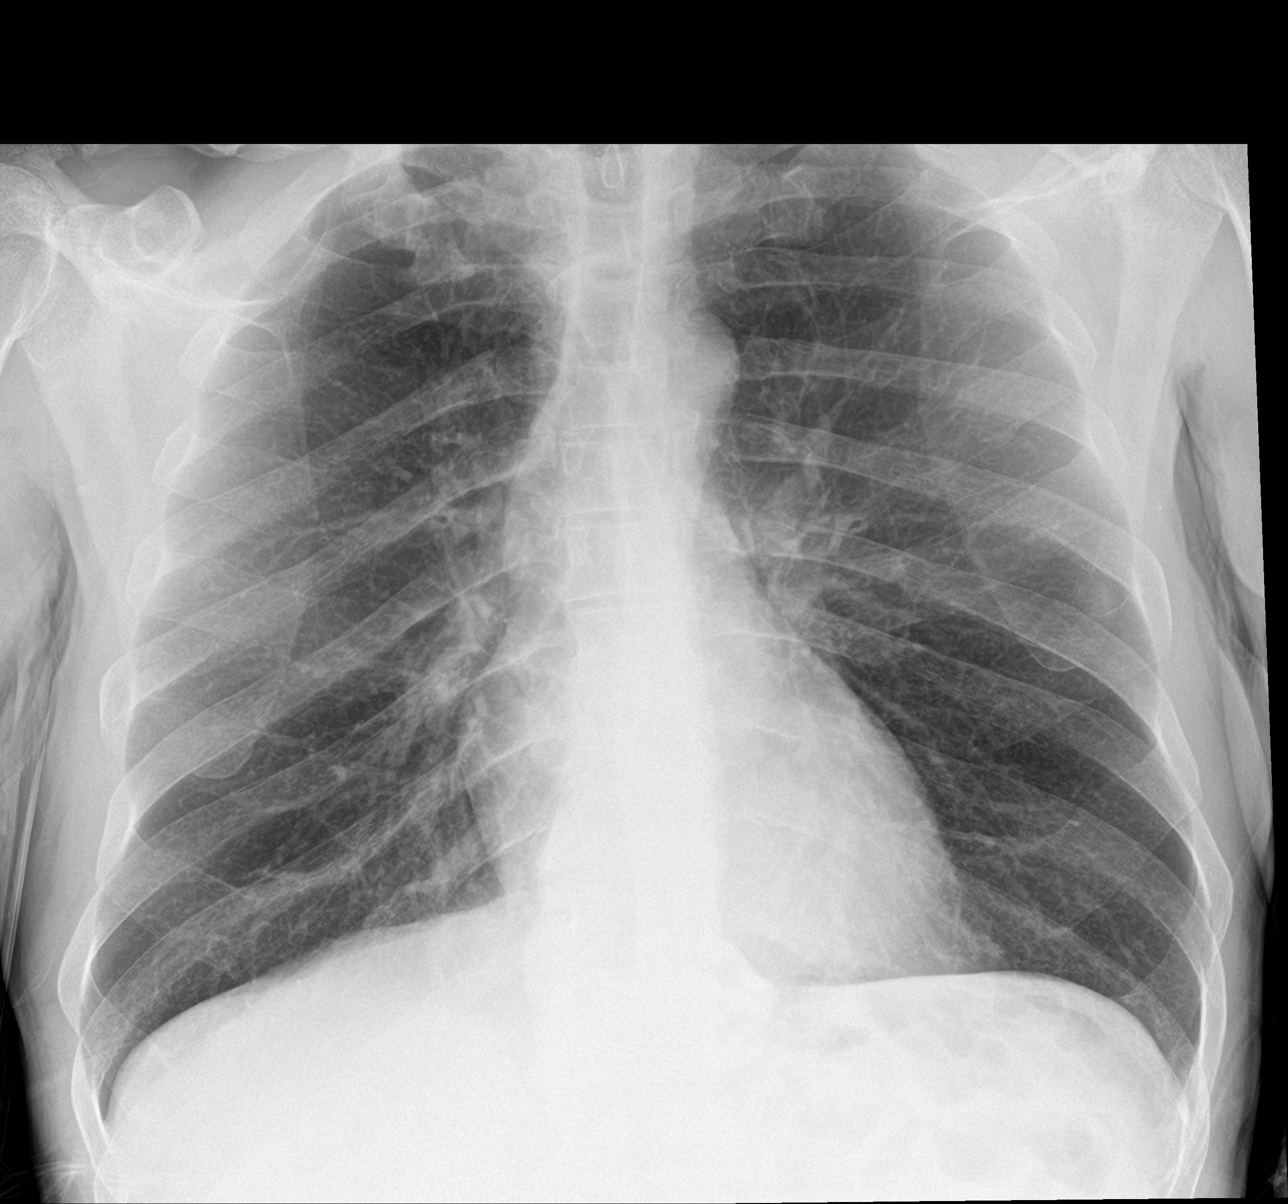

[chest lat (2 of 2)]
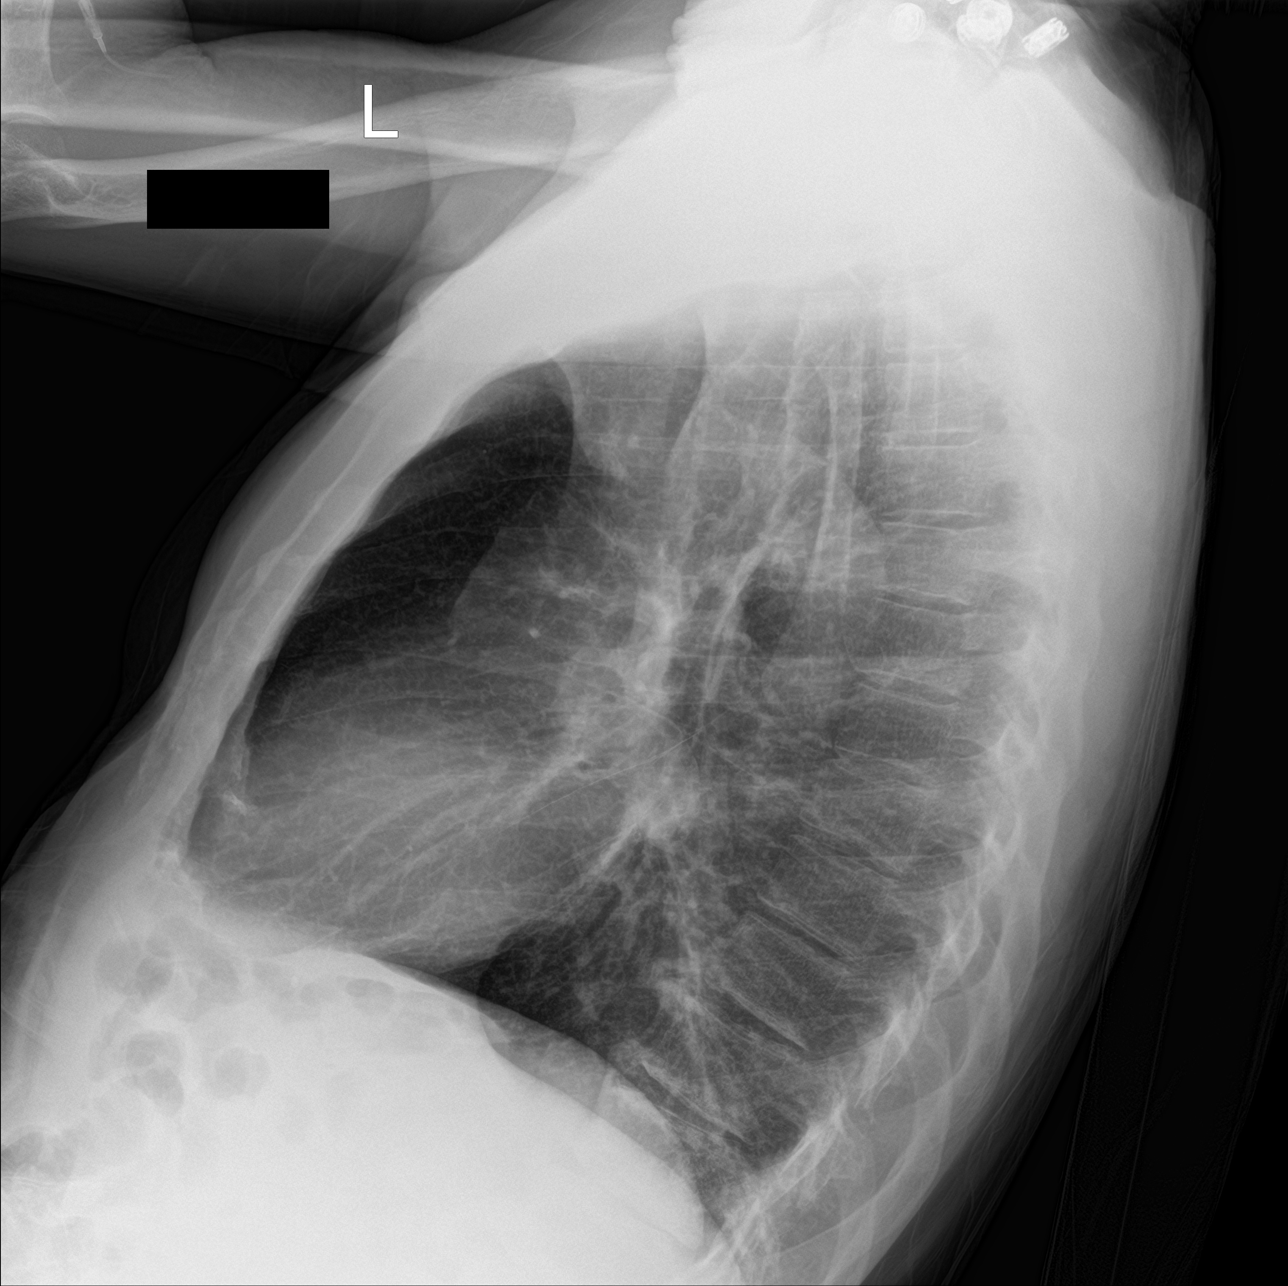

[3 of 3 positions shown; findings below may reference images not displayed]

FINDINGS: The heart size and mediastinal contours are within normal limits.
Both lungs are clear. Old right clavicle fracture.
IMPRESSION: No active cardiopulmonary disease.

## 2022-04-11 DIAGNOSIS — M1990 Unspecified osteoarthritis, unspecified site: Secondary | ICD-10-CM | POA: Diagnosis not present

## 2022-04-11 DIAGNOSIS — Z13220 Encounter for screening for lipoid disorders: Secondary | ICD-10-CM | POA: Diagnosis not present
